# Patient Record
Sex: Female | Born: 1980 | Race: Black or African American | Hispanic: No | Marital: Single | Smoking: Current every day smoker
Health system: Southern US, Community
[De-identification: ages and names within clinical notes are randomized; demographics above are authoritative.]

## PROBLEM LIST (undated history)

## (undated) ENCOUNTER — Ambulatory Visit (HOSPITAL_COMMUNITY): Discharge status: Absent without leave | Payer: No Typology Code available for payment source

---
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
## 1999-01-01 ENCOUNTER — Inpatient Hospital Stay (HOSPITAL_COMMUNITY): Admission: AD | Admit: 1999-01-01 | Discharge: 1999-01-01 | Payer: Self-pay | Admitting: Obstetrics

## 1999-02-01 ENCOUNTER — Inpatient Hospital Stay: Admission: AD | Admit: 1999-02-01 | Discharge: 1999-02-01 | Payer: Self-pay | Admitting: *Deleted

## 1999-02-22 ENCOUNTER — Ambulatory Visit (HOSPITAL_COMMUNITY): Admission: RE | Admit: 1999-02-22 | Discharge: 1999-02-22 | Payer: Self-pay | Admitting: *Deleted

## 1999-04-08 ENCOUNTER — Ambulatory Visit (HOSPITAL_COMMUNITY): Admission: RE | Admit: 1999-04-08 | Discharge: 1999-04-08 | Payer: Self-pay | Admitting: *Deleted

## 1999-04-11 ENCOUNTER — Observation Stay (HOSPITAL_COMMUNITY): Admission: AD | Admit: 1999-04-11 | Discharge: 1999-04-12 | Payer: Self-pay | Admitting: Obstetrics

## 1999-05-09 ENCOUNTER — Inpatient Hospital Stay (HOSPITAL_COMMUNITY): Admission: AD | Admit: 1999-05-09 | Discharge: 1999-05-09 | Payer: Self-pay | Admitting: Obstetrics

## 1999-06-06 ENCOUNTER — Inpatient Hospital Stay (HOSPITAL_COMMUNITY): Admission: AD | Admit: 1999-06-06 | Discharge: 1999-06-06 | Payer: Self-pay | Admitting: Obstetrics

## 1999-06-22 ENCOUNTER — Ambulatory Visit (HOSPITAL_COMMUNITY): Admission: RE | Admit: 1999-06-22 | Discharge: 1999-06-22 | Payer: Self-pay | Admitting: Internal Medicine

## 1999-06-22 ENCOUNTER — Encounter: Payer: Self-pay | Admitting: Internal Medicine

## 1999-07-15 ENCOUNTER — Encounter: Payer: Self-pay | Admitting: Obstetrics & Gynecology

## 1999-07-15 ENCOUNTER — Encounter (INDEPENDENT_AMBULATORY_CARE_PROVIDER_SITE_OTHER): Payer: Self-pay | Admitting: Specialist

## 1999-07-15 ENCOUNTER — Inpatient Hospital Stay (HOSPITAL_COMMUNITY): Admission: AD | Admit: 1999-07-15 | Discharge: 1999-07-18 | Payer: Self-pay | Admitting: Obstetrics & Gynecology

## 1999-07-20 ENCOUNTER — Inpatient Hospital Stay (HOSPITAL_COMMUNITY): Admission: AD | Admit: 1999-07-20 | Discharge: 1999-07-20 | Payer: Self-pay | Admitting: Obstetrics

## 2003-05-11 ENCOUNTER — Emergency Department (HOSPITAL_COMMUNITY): Admission: EM | Admit: 2003-05-11 | Discharge: 2003-05-11 | Payer: Self-pay | Admitting: Emergency Medicine

## 2003-07-23 ENCOUNTER — Ambulatory Visit (HOSPITAL_COMMUNITY): Admission: RE | Admit: 2003-07-23 | Discharge: 2003-07-23 | Payer: Self-pay | Admitting: Obstetrics & Gynecology

## 2003-10-05 ENCOUNTER — Inpatient Hospital Stay (HOSPITAL_COMMUNITY): Admission: AD | Admit: 2003-10-05 | Discharge: 2003-10-06 | Payer: Self-pay | Admitting: Obstetrics

## 2003-10-13 ENCOUNTER — Ambulatory Visit (HOSPITAL_COMMUNITY): Admission: RE | Admit: 2003-10-13 | Discharge: 2003-10-13 | Payer: Self-pay | Admitting: Obstetrics

## 2003-12-21 ENCOUNTER — Inpatient Hospital Stay (HOSPITAL_COMMUNITY): Admission: AD | Admit: 2003-12-21 | Discharge: 2003-12-21 | Payer: Self-pay | Admitting: Obstetrics

## 2004-02-10 ENCOUNTER — Inpatient Hospital Stay (HOSPITAL_COMMUNITY): Admission: AD | Admit: 2004-02-10 | Discharge: 2004-02-10 | Payer: Self-pay | Admitting: Obstetrics

## 2004-03-02 ENCOUNTER — Inpatient Hospital Stay (HOSPITAL_COMMUNITY): Admission: AD | Admit: 2004-03-02 | Discharge: 2004-03-06 | Payer: Self-pay | Admitting: Obstetrics & Gynecology

## 2004-03-03 ENCOUNTER — Encounter (INDEPENDENT_AMBULATORY_CARE_PROVIDER_SITE_OTHER): Payer: Self-pay | Admitting: Specialist

## 2005-10-28 ENCOUNTER — Emergency Department (HOSPITAL_COMMUNITY): Admission: EM | Admit: 2005-10-28 | Discharge: 2005-10-28 | Payer: Self-pay | Admitting: *Deleted

## 2006-01-29 ENCOUNTER — Emergency Department (HOSPITAL_COMMUNITY): Admission: EM | Admit: 2006-01-29 | Discharge: 2006-01-29 | Payer: Self-pay | Admitting: Emergency Medicine

## 2006-02-08 ENCOUNTER — Emergency Department (HOSPITAL_COMMUNITY): Admission: EM | Admit: 2006-02-08 | Discharge: 2006-02-08 | Payer: Self-pay | Admitting: Family Medicine

## 2009-11-08 ENCOUNTER — Emergency Department (HOSPITAL_COMMUNITY)
Admission: EM | Admit: 2009-11-08 | Discharge: 2009-11-08 | Payer: Self-pay | Source: Home / Self Care | Admitting: Emergency Medicine

## 2010-06-09 LAB — URINALYSIS, ROUTINE W REFLEX MICROSCOPIC
Protein, ur: NEGATIVE mg/dL
Urobilinogen, UA: 1 mg/dL (ref 0.0–1.0)
pH: 7.5 (ref 5.0–8.0)

## 2010-06-09 LAB — COMPREHENSIVE METABOLIC PANEL
AST: 20 U/L (ref 0–37)
Albumin: 3.7 g/dL (ref 3.5–5.2)
BUN: 10 mg/dL (ref 6–23)
Calcium: 9 mg/dL (ref 8.4–10.5)
Creatinine, Ser: 0.77 mg/dL (ref 0.4–1.2)
GFR calc Af Amer: >60 mL/min (ref >60)
GFR calc non Af Amer: >60 mL/min (ref >60)
Glucose, Bld: 101 mg/dL — ABNORMAL HIGH (ref 70–99)
Potassium: 3.6 mEq/L (ref 3.5–5.1)
Total Bilirubin: 0.2 mg/dL — ABNORMAL LOW (ref 0.3–1.2)
Total Protein: 6.9 g/dL (ref 6.0–8.3)

## 2010-06-09 LAB — DIFFERENTIAL
Basophils Absolute: 0 10*3/uL (ref 0.0–0.1)
Basophils Relative: 1 % (ref 0–1)
Eosinophils Relative: 3 % (ref 0–5)
Lymphocytes Relative: 43 % (ref 12–46)
Monocytes Absolute: 0.4 10*3/uL (ref 0.1–1.0)
Monocytes Relative: 7 % (ref 3–12)
Neutrophils Relative %: 46 % (ref 43–77)

## 2010-06-09 LAB — URINE MICROSCOPIC-ADD ON

## 2010-06-09 LAB — CBC
MCH: 33.1 pg (ref 26.0–34.0)
Platelets: 241 10*3/uL (ref 150–400)
WBC: 5.3 10*3/uL (ref 4.0–10.5)

## 2010-06-19 ENCOUNTER — Inpatient Hospital Stay (INDEPENDENT_AMBULATORY_CARE_PROVIDER_SITE_OTHER)
Admission: RE | Admit: 2010-06-19 | Discharge: 2010-06-19 | Disposition: A | Discharge status: Home / Self Care | Payer: Self-pay | Source: Ambulatory Visit | Attending: Family Medicine | Admitting: Family Medicine

## 2010-06-19 DIAGNOSIS — N760 Acute vaginitis: Secondary | ICD-10-CM

## 2010-06-19 LAB — WET PREP, GENITAL: Trich, Wet Prep: NONE SEEN

## 2010-06-19 LAB — POCT URINALYSIS DIP (DEVICE)
Bilirubin Urine: NEGATIVE
Glucose, UA: NEGATIVE mg/dL
pH: 5.5 (ref 5.0–8.0)

## 2010-06-19 LAB — POCT PREGNANCY, URINE: Preg Test, Ur: NEGATIVE

## 2010-06-21 ENCOUNTER — Emergency Department (HOSPITAL_COMMUNITY)
Admission: EM | Admit: 2010-06-21 | Discharge: 2010-06-21 | Disposition: A | Discharge status: Home / Self Care | Payer: Medicaid Other | Attending: Emergency Medicine | Admitting: Emergency Medicine

## 2010-06-21 ENCOUNTER — Emergency Department (HOSPITAL_COMMUNITY): Payer: Medicaid Other

## 2010-06-21 DIAGNOSIS — M79609 Pain in unspecified limb: Secondary | ICD-10-CM | POA: Insufficient documentation

## 2010-06-21 DIAGNOSIS — R209 Unspecified disturbances of skin sensation: Secondary | ICD-10-CM | POA: Insufficient documentation

## 2010-06-21 DIAGNOSIS — M7989 Other specified soft tissue disorders: Secondary | ICD-10-CM | POA: Insufficient documentation

## 2010-06-21 DIAGNOSIS — I10 Essential (primary) hypertension: Secondary | ICD-10-CM | POA: Insufficient documentation

## 2010-06-21 LAB — URINALYSIS, ROUTINE W REFLEX MICROSCOPIC
Ketones, ur: NEGATIVE mg/dL
Nitrite: NEGATIVE
Urobilinogen, UA: 1 mg/dL (ref 0.0–1.0)

## 2010-06-21 LAB — POCT I-STAT, CHEM 8
Calcium, Ion: 1.18 mmol/L (ref 1.12–1.32)
HCT: 39 % (ref 36.0–46.0)
Potassium: 3.9 mEq/L (ref 3.5–5.1)

## 2010-06-21 LAB — URINE MICROSCOPIC-ADD ON

## 2010-06-21 LAB — GC/CHLAMYDIA PROBE AMP, GENITAL
Chlamydia, DNA Probe: NEGATIVE
GC Probe Amp, Genital: NEGATIVE

## 2010-06-21 LAB — POCT PREGNANCY, URINE: Preg Test, Ur: NEGATIVE

## 2010-08-12 NOTE — Op Note (Signed)
[name], [name]              ACCOUNT NO.:  [id number]   MEDICAL RECORD NO.:  [id number]          PATIENT TYPE:  INP   LOCATION:  9104                          FACILITY:  [facility]   PHYSICIAN:  Charles A. Clearance Coots, M.D.DATE OF BIRTH:  02-Jun-1980   DATE OF PROCEDURE:  03/03/2004  DATE OF DISCHARGE:                                 OPERATIVE REPORT   PREOPERATIVE DIAGNOSES:  1.  Term pregnancy.  2.  Pregnancy-induced hypertension.  3.  Previous cesarean section, desired repeat cesarean section.  4.  Desires sterilization.   POSTOPERATIVE DIAGNOSES:  1.  Term pregnancy.  2.  Pregnancy-induced hypertension.  3.  Previous cesarean section, desired repeat cesarean section.  4.  Desires sterilization.   PROCEDURE:  Repeat low transverse cesarean section and bilateral partial  salpingectomy, Pomeroy technique.   SURGEONS:  Charles A. Clearance Coots, M.D.   ANESTHESIA:  Spinal.   ESTIMATED BLOOD LOSS:  800 mL.   INTRAVENOUS FLUIDS:  2100 mL.   URINE OUT:  500 mL.   COMPLICATIONS:  None.   DRAINS:  Foley to gravity.   FINDINGS:  Viable female at 0658,  Apgars of 8 at one minute, 9 at five  minutes.  Weight of 6 pounds, 3 ounces.  Normal uterus, ovaries, and  fallopian tubes.   SPECIMENS:  Approximately 2 cm segments of right and left fallopian tubes.   OPERATION:  The patient was brought to the operating room and after  satisfactory spinal anesthesia, the abdomen was prepped and draped in the  usual sterile fashion.  A Pfannenstiel skin incision was made through the  previous scar with the scalpel down to the fascia.  The fascia was nicked in  the midline and the fascial incision was extended to the left and to the  right with curved Mayo scissors.  The superior and inferior fascial edges  were taken off the rectus muscles with both blunt and sharp dissection.  The  rectus muscle was sharply divided in the midline.  The peritoneum was  entered digitally.  The peritoneum was then  extended to the left and to the  right digitally.  The bladder blade was positioned.  The vesicouterine fold  of the peritoneum above the reflection of the urinary bladder was grasped  with forceps and was incised and undermined with Metzenbaum scissors.  The  incision was extended to the left and to the right with the Metzenbaum  scissors.  The bladder flap was bluntly developed and the bladder blade was  respositioned in front of the urinary bladder, placing it well out of the  operative field.  The uterus was then entered in the lower uterine segment  transversely with the scalpel.  Clear amniotic fluid was expelled.  The  uterine incision was then extended to the left and to the right digitally.  The fetal was vertex was then delivered with the aid of fundal pressure from  the assistant.  The infant's mouth and nose were suctioned with a suction  bulb and the delivery was completed with aid of fundal pressure from the  assistant.  The umbilical cord was  doubly clamped and cut and the infant was  handed off to the nursery staff.  Cord blood was obtained. The placenta was  spontaneously expelled from the uterine cavity intact.  The uterus was  exteriorized and the endometrial surface was thoroughly debrided with the  dry lap sponge.  The edges of the uterine incision were grasped with ring  forceps.  The uterus was closed with continuous interlocking suture of 0  Monocryl.  Hemostasis was excellent.   Attention was then turned above the tubal ligation procedure.  The left  fallopian tube was grasped with a Babcock clamp and was identified from the  cornual end to the fimbrial end and grasped with a Babcock clamp.  The  knuckle of tube beneath the Babcock clamp in the isthmic area of the tube  was then doubly ligated with #1 plain cat gut.  Dissection of tube above the  knot was excised and submitted to pathology for evaluation.  There was no  active bleeding from the tubal stumps.  The  same procedure was performed on  the opposite side without complications.  The uterus was then placed back in  its normal anatomic position.  The pelvic cavity was thoroughly irrigated  with warm saline solution and all clots were removed.   The abdomen was then closed as follows:  The peritoneum was closed with  continuous suture of 2-0 Monocryl.  The fascia was closed with continuous  suture of 0 PDS from each corner to the center.  The subcutaneous tissue was  thoroughly irrigated with warm saline solution and all areas of subcutaneous  bleeding were coagulated with the Bovie.  The skin was then closed with  continuous subcuticular suture of 4-0 Monocryl.  Sterile bandage was applied  to the incision closure.  Surgical technician indicated that all needle,  sponge, and instrument counts were correct.  The patient tolerated the  procedure well and was transported to the recovery room in satisfactory  condition.     Char   CAH/MEDQ  D:  03/03/2004  T:  03/03/2004  Job:  [id number]

## 2010-08-12 NOTE — Op Note (Signed)
[facility]  Patient:    [name], [name]                     MRN: [id number] Proc. Date: 07/15/99 Adm. Date:  [id number] Disc. Date: [id number] Attending:  Tammi Sou                           Operative Report  PREOPERATIVE DIAGNOSES:       Intrauterine pregnancy at 72- to 38-weeks gestation with fetal tachycardia and decreased fetal activity.  POSTOPERATIVE DIAGNOSES:      Intrauterine pregnancy at 20- to 38-weeks gestation with fetal tachycardia and decreased fetal activity.  OPERATION:                    Low transverse cesarean.  OPERATORS:                    Conni Elliot, M.D.                               Delano Metz, M.D.  ANESTHESIA:  FINDINGS:                     A 5-pound 13-ounce female with Apgars pending, cord pH of 7.27.  DESCRIPTION OF PROCEDURE:     After placing the patient under spinal anesthetic, with patient supine and left lateral tilt position and receiving oxygen, abdomen was prepped and draped in a sterile fashion.  A low transverse Pfannenstiel incision was made and incision was made through skin, subcutaneous, fascia and rectus muscle separated in the midline.  Peritoneal cavity was entered and bladder flap created.  Low transverse uterine incision was made.  Baby was delivered in a vertex presentation, cord double clamped and cut and baby handed to neonatologist in attendance.  Placenta was delivered spontaneously.  The left uterine arteries were lacerated and therefore, the blood loss was approximately 1000 cc; however, this was repaired without difficulty and hemostasis was adequate at the closure of the uterus.  Bladder flap, anterior peritoneum, fascia, subcutaneous and skin were closed in routine fashion.  Estimated blood loss was approximately 1000 cc. Needle, instrument and sponge count correct. DD:  07/16/99 TD:  07/18/99 Job: [id number]

## 2010-08-12 NOTE — Op Note (Signed)
[facility]  Patient:    [name], [name]                     MRN: [id number] Adm. Date:  [id number] Attending:  Antionette Char                           Operative Report  NO DICTATION DD:  07/18/99 TD:  07/18/99 Job: [id number]

## 2010-08-12 NOTE — Discharge Summary (Signed)
[facility]  Patient:    [name], [name]                     MRN: [id number] Adm. Date:  [id number] Disc. Date: 07/18/99 Attending:  Antionette Char Dictator:   Gilmore Laroche, M.D.                           Discharge Summary  DISCHARGE DIAGNOSES:          1. Status post primary low transverse cesarean                                  section.                               2. Persistent fetal tachycardia.                               3. Decreased fetal activity.  DISCHARGE MEDICATIONS:        1. Percocet 1/1 p.o. q.6h. p.r.n. (given prescription                                  for 15 with no refills).                               2. Motrin 600 mg p.o. q.6h. (30 given).                               3. Ferrous sulfate 325 mg 1/1 p.o. q.d.  PROCEDURE:                    Primary low transverse cesarean section.  COMPLICATIONS:                None.  BRIEF HISTORY:                This is a 30 year old, gravida 2, para 1-0-0-1, who is at 37-4/7 weeks who came in because of contractions.  She was found to have decreased irritability on the monitor and was admitted to L&D.  HOSPITAL COURSE:              The baby had poor variability and tachycardia on monitor and thus she was taken to cesarean section.  She tolerated this well without problems and delivered a viable babygirl with a cord pH of 7.27 and Apgars of 8 and 9.  She had an uncomplicated postoperative course.  She was ambulating and tolerating p.o.  The wound was clean and dry and the incision looked good.  The  patient was eager to go home on July 18, 1999.  FOLLOW-UP:                    The patient is to follow up at [facility] in six weeks.  She is taking Depo for birth control and was given instructions regarding onset of severe abdominal pain, purulence, or dehiscence as reasons for return. DD:  07/18/99 TD:  07/18/99 Job: [id number]

## 2010-11-27 ENCOUNTER — Inpatient Hospital Stay (INDEPENDENT_AMBULATORY_CARE_PROVIDER_SITE_OTHER)
Admission: RE | Admit: 2010-11-27 | Discharge: 2010-11-27 | Disposition: A | Discharge status: Home / Self Care | Payer: Medicaid Other | Source: Ambulatory Visit | Attending: Family Medicine | Admitting: Family Medicine

## 2010-11-27 DIAGNOSIS — L538 Other specified erythematous conditions: Secondary | ICD-10-CM

## 2010-11-27 DIAGNOSIS — N760 Acute vaginitis: Secondary | ICD-10-CM

## 2010-12-25 ENCOUNTER — Emergency Department (HOSPITAL_COMMUNITY)
Admission: EM | Admit: 2010-12-25 | Discharge: 2010-12-25 | Disposition: A | Discharge status: Home / Self Care | Payer: Medicaid Other | Attending: Emergency Medicine | Admitting: Emergency Medicine

## 2010-12-25 DIAGNOSIS — L0201 Cutaneous abscess of face: Secondary | ICD-10-CM | POA: Insufficient documentation

## 2010-12-25 DIAGNOSIS — I10 Essential (primary) hypertension: Secondary | ICD-10-CM | POA: Insufficient documentation

## 2010-12-25 DIAGNOSIS — R51 Headache: Secondary | ICD-10-CM | POA: Insufficient documentation

## 2010-12-25 DIAGNOSIS — R220 Localized swelling, mass and lump, head: Secondary | ICD-10-CM | POA: Insufficient documentation

## 2010-12-25 DIAGNOSIS — L03211 Cellulitis of face: Secondary | ICD-10-CM | POA: Insufficient documentation

## 2012-05-18 ENCOUNTER — Emergency Department (HOSPITAL_COMMUNITY)
Admission: EM | Admit: 2012-05-18 | Discharge: 2012-05-18 | Disposition: A | Discharge status: Home / Self Care | Payer: Medicaid Other | Attending: Emergency Medicine | Admitting: Emergency Medicine

## 2012-05-18 ENCOUNTER — Encounter (HOSPITAL_COMMUNITY): Payer: Self-pay | Admitting: Emergency Medicine

## 2012-05-18 DIAGNOSIS — L299 Pruritus, unspecified: Secondary | ICD-10-CM | POA: Insufficient documentation

## 2012-05-18 DIAGNOSIS — B359 Dermatophytosis, unspecified: Secondary | ICD-10-CM | POA: Insufficient documentation

## 2012-05-18 DIAGNOSIS — F17200 Nicotine dependence, unspecified, uncomplicated: Secondary | ICD-10-CM | POA: Insufficient documentation

## 2012-05-18 MED ORDER — CLOTRIMAZOLE 1 % EX CREA
TOPICAL_CREAM | Freq: Two times a day (BID) | CUTANEOUS | Status: DC
  Filled 2012-05-18: qty 15

## 2012-05-18 MED ORDER — CLOTRIMAZOLE 1 % EX CREA: TOPICAL_CREAM | Freq: Two times a day (BID) | CUTANEOUS | Status: DC

## 2012-05-18 NOTE — ED Provider Notes (Signed)
History  This chart was scribed for Roxy Horseman, non-physician practitioner, working with Carleene Cooper III, MD by Bennett Scrape, ED Scribe. This patient was seen in room TR08C/TR08C and the patient's care was started at 10:39 PM.  CSN: [id number]  Arrival date & time 05/18/12  2110   First MD Initiated Contact with Patient 05/18/12 2239      Chief Complaint  Patient presents with  . Open Wound     The history is provided by the patient. No language interpreter was used.    [name] is a 32 y.o. female who presents to the Emergency Department for chief complaint of a 7 days of gradual onset, gradually worsening, constant red wound with associated itchiness to the left lower leg. She denies having prior episodes of similar symptoms. She denies any known injuries, recent falls or contact with animals. She denies fever, neck pain, sore throat, visual disturbance, CP, cough, SOB, abdominal pain, nausea, emesis, diarrhea, urinary symptoms, back pain, HA, weakness, numbness and rash as associated symptoms. She does not have a h/o chronic medical conditions and is a current everyday smoker but denies alcohol use.  History reviewed. No pertinent past medical history.  Past Surgical History  Procedure Laterality Date  . Cesarean section      No family history on file.  History  Substance Use Topics  . Smoking status: Current Every Day Smoker  . Smokeless tobacco: Not on file  . Alcohol Use: No    No OB history provided.  Review of Systems  A complete 10 system review of systems was obtained and all systems are negative except as noted in the HPI and PMH.   Allergies  Review of patient's allergies indicates no known allergies.  Home Medications  No current outpatient prescriptions on file.  Triage Vitals: BP 118/60  Pulse 67  Temp(Src) 98.1 F (36.7 C) (Oral)  Resp 16  SpO2 100%  LMP 05/09/2012  Physical Exam  Nursing note and vitals  reviewed. Constitutional: She is oriented to person, place, and time. She appears well-developed and well-nourished. No distress.  HENT:  Head: Normocephalic and atraumatic.  Eyes: EOM are normal.  Neck: Neck supple. No tracheal deviation present.  Cardiovascular: Normal rate.   Pulmonary/Chest: Effort normal. No respiratory distress.  Musculoskeletal: Normal range of motion.  Neurological: She is alert and oriented to person, place, and time.  Skin: Skin is warm and dry.  2 X 2 cm lesion characteristic of ring worm to the left lower leg  Psychiatric: She has a normal mood and affect. Her behavior is normal.    ED Course  Procedures (including critical care time)  DIAGNOSTIC STUDIES: Oxygen Saturation is 100% on room air, normal by my interpretation.    COORDINATION OF CARE: 10:53 PM- Discussed discharge plan which includes cream with pt and pt agreed to plan.   Labs Reviewed - No data to display No results found.   1. Ringworm       MDM  Uncomplicated tinea infection.     I personally performed the services described in this documentation, which was scribed in my presence. The recorded information has been reviewed and is accurate.    Roxy Horseman, PA-C 05/19/12 0005

## 2012-05-18 NOTE — ED Notes (Signed)
Pt denies fever/chills; pt denies n/v/d; pt alert and mentating appropriately; pt denies pain to area; pt denies numbness/tingling; pt states area is itchy

## 2012-05-18 NOTE — ED Notes (Signed)
Pt ambulatory leaving ed; pt given d/c teaching and prescriptions; pt educated on wound care and cream application; pt alert and mentating appropriately upon d/c. Pt has no further questions upon d/c teaching.

## 2012-05-18 NOTE — ED Notes (Signed)
Pt c/o blister type wound to left lower leg x's 1 week.  No known injury, st's area itches

## 2012-05-19 NOTE — ED Provider Notes (Signed)
Medical screening examination/treatment/procedure(s) were performed by non-physician practitioner and as supervising physician I was immediately available for consultation/collaboration.   Carleene Cooper III, MD 05/19/12 1320

## 2012-06-02 ENCOUNTER — Encounter (HOSPITAL_COMMUNITY): Payer: Self-pay | Admitting: *Deleted

## 2012-06-02 ENCOUNTER — Emergency Department (HOSPITAL_COMMUNITY)
Admission: EM | Admit: 2012-06-02 | Discharge: 2012-06-02 | Disposition: A | Discharge status: Home / Self Care | Payer: Medicaid Other | Source: Home / Self Care

## 2012-06-02 MED ORDER — PROMETHAZINE HCL 12.5 MG PO TABS: 12.5000 mg | ORAL_TABLET | Freq: Four times a day (QID) | ORAL | Status: DC | PRN

## 2012-06-02 MED ORDER — HYDROCODONE-ACETAMINOPHEN 5-500 MG PO TABS: 1.0000 | ORAL_TABLET | Freq: Four times a day (QID) | ORAL | Status: DC | PRN

## 2012-06-02 NOTE — ED Notes (Signed)
Patient complains of abdominal pain, nausea, vomiting and diarrhea that started this morning with fever/chills.

## 2012-06-02 NOTE — ED Provider Notes (Addendum)
History     CSN: [id number]  Arrival date & time 06/02/12  1250   First MD Initiated Contact with Patient 06/02/12 1253      Chief Complaint  Patient presents with  . Nausea    (Consider location/radiation/quality/duration/timing/severity/associated sxs/prior treatment) HPI This is a 32 year old female who presents with complaints of vomiting and diarrhea and lower abdominal pain which started this morning. She has vomited twice and had about 3 loose bowel movements. She has not had any high fevers. She has pain in her lower abdomen which shows bilateral. She has not been around anyone who is sick with similar symptoms and did not eat out in a restaurant last night. No noted blood in vomitus or in stool.   History reviewed. No pertinent past medical history.  Past Surgical History  Procedure Laterality Date  . Cesarean section      No family history on file.  History  Substance Use Topics  . Smoking status: Current Every Day Smoker  . Smokeless tobacco: Not on file  . Alcohol Use: No    OB History   Grav Para Term Preterm Abortions TAB SAB Ect Mult Living                  Review of Systems  Constitutional: Positive for appetite change and fatigue.  HENT: Negative.   Eyes: Negative.   Respiratory: Negative.   Cardiovascular: Negative.   Gastrointestinal: Positive for vomiting, abdominal pain and diarrhea.  Endocrine: Negative.   Genitourinary: Negative.   Musculoskeletal: Negative.   Skin: Negative.   Neurological: Negative.   Hematological: Negative.   Psychiatric/Behavioral: Negative.     Allergies  Review of patient's allergies indicates no known allergies.  Home Medications   Current Outpatient Rx  Name  Route  Sig  Dispense  Refill  . clotrimazole (LOTRIMIN) 1 % cream   Topical   Apply topically 2 (two) times daily.   30 g   1   . HYDROcodone-acetaminophen (VICODIN) 5-500 MG per tablet   Oral   Take 1 tablet by mouth every 6 (six) hours as  needed for pain.   10 tablet   0   . promethazine (PHENERGAN) 12.5 MG tablet   Oral   Take 1 tablet (12.5 mg total) by mouth every 6 (six) hours as needed for nausea.   30 tablet   0     BP 113/72  Pulse 91  Temp(Src) 98.5 F (36.9 C) (Oral)  Resp 18  SpO2 96%  LMP 06/02/2012  Physical Exam  Constitutional: She appears well-developed and well-nourished.  HENT:  Head: Normocephalic and atraumatic.  Eyes: Conjunctivae and EOM are normal. Pupils are equal, round, and reactive to light.  Neck: Normal range of motion. Neck supple.  Cardiovascular: Normal rate and regular rhythm.   Pulmonary/Chest: Effort normal and breath sounds normal.  Abdominal: Soft. She exhibits no mass. There is tenderness. There is guarding. There is no rebound.  Tenderness more prominent on the left mid and lower abdomen and less pronounced in the right lower quadrant. No significant tenderness in the suprapubic area.  Neurological: She is alert.  Skin: Skin is warm and dry.  Psychiatric: She has a normal mood and affect.    ED Course  Procedures (including critical care time)  Labs Reviewed - No data to display No results found.   1. Viral gastroenteritis       MDM  Phenergan and low-dose Vicodin, limited diet with only clear liquids over the  next 24 hours to be advanced slowly over the next 3 days. If symptoms do not improve in the next 24-48 hours or if they worsen, she is advised to go to the emergency room.        Calvert Cantor, MD 06/02/12 1439  Calvert Cantor, MD 06/02/12 1610

## 2012-10-20 ENCOUNTER — Emergency Department (HOSPITAL_COMMUNITY)
Admission: EM | Admit: 2012-10-20 | Discharge: 2012-10-20 | Disposition: A | Discharge status: Home / Self Care | Payer: Medicaid Other | Attending: Emergency Medicine | Admitting: Emergency Medicine

## 2012-10-20 ENCOUNTER — Encounter (HOSPITAL_COMMUNITY): Payer: Self-pay | Admitting: *Deleted

## 2012-10-20 DIAGNOSIS — S61216A Laceration without foreign body of right little finger without damage to nail, initial encounter: Secondary | ICD-10-CM

## 2012-10-20 DIAGNOSIS — Y9389 Activity, other specified: Secondary | ICD-10-CM | POA: Insufficient documentation

## 2012-10-20 DIAGNOSIS — S61209A Unspecified open wound of unspecified finger without damage to nail, initial encounter: Secondary | ICD-10-CM | POA: Insufficient documentation

## 2012-10-20 DIAGNOSIS — Z23 Encounter for immunization: Secondary | ICD-10-CM | POA: Insufficient documentation

## 2012-10-20 DIAGNOSIS — Y9289 Other specified places as the place of occurrence of the external cause: Secondary | ICD-10-CM | POA: Insufficient documentation

## 2012-10-20 DIAGNOSIS — W2209XA Striking against other stationary object, initial encounter: Secondary | ICD-10-CM | POA: Insufficient documentation

## 2012-10-20 DIAGNOSIS — F17200 Nicotine dependence, unspecified, uncomplicated: Secondary | ICD-10-CM | POA: Insufficient documentation

## 2012-10-20 MED ORDER — TETANUS-DIPHTH-ACELL PERTUSSIS 5-2.5-18.5 LF-MCG/0.5 IM SUSP
0.5000 mL | Freq: Once | INTRAMUSCULAR | Status: AC
  Administered 2012-10-20: 0.5 mL via INTRAMUSCULAR
  Filled 2012-10-20: qty 0.5

## 2012-10-20 NOTE — ED Notes (Signed)
The pt has a laceration to the rt little finger.  She punched a mirror earlier today.  Minimal bleeding

## 2012-10-20 NOTE — ED Provider Notes (Signed)
  CSN: [id number]     Arrival date & time 10/20/12  2058 History     First MD Initiated Contact with Patient 10/20/12 2113     Chief Complaint  Patient presents with  . Laceration   (Consider location/radiation/quality/duration/timing/severity/associated sxs/prior Treatment) HPI History provided by pt.   Pt punched a mirror just pta and sustained a laceration to palmar surface of right pinky.  C/o severe pain.  No associated paresthesias.  Has not cleaned.  Last tetanus unknown.   History reviewed. No pertinent past medical history. Past Surgical History  Procedure Laterality Date  . Cesarean section     No family history on file. History  Substance Use Topics  . Smoking status: Current Every Day Smoker  . Smokeless tobacco: Not on file  . Alcohol Use: No   OB History   Grav Para Term Preterm Abortions TAB SAB Ect Mult Living                 Review of Systems  All other systems reviewed and are negative.    Allergies  Benadryl  Home Medications  No current outpatient prescriptions on file. BP 119/49  Pulse 87  Temp(Src) 97.7 F (36.5 C) (Oral)  Resp 19  SpO2 96% Physical Exam  Nursing note and vitals reviewed. Constitutional: She is oriented to person, place, and time. She appears well-developed and well-nourished. No distress.  HENT:  Head: Normocephalic and atraumatic.  Eyes:  Normal appearance  Neck: Normal range of motion.  Pulmonary/Chest: Effort normal.  Musculoskeletal: Normal range of motion.  Irregular, Subq, hemostatic and clean lac w/ overlying skin flap on palmar surface lateral right pinky.  No foreign body.  Decreased sensation just distal, but nml sensation rest of finger.  Brisk cap refill.  Neurological: She is alert and oriented to person, place, and time.  Psychiatric: She has a normal mood and affect. Her behavior is normal.    ED Course   Procedures (including critical care time) LACERATION REPAIR Performed by: Otilio Miu Authorized by: Ruby Cola E Consent: Verbal consent obtained. Risks and benefits: risks, benefits and alternatives were discussed Consent given by: patient Patient identity confirmed: provided demographic data Prepped and Draped in normal sterile fashion Wound explored  Laceration Location: right pinky  Laceration Length: 2cm  No Foreign Bodies seen or palpated  Anesthesia: local infiltration  Digital block: lidocaine 2% w/out epinephrine  Anesthetic total: 4 ml  Irrigation method: Nursing staff performed Amount of cleaning: standard  Skin closure: prolene 4.0 Number of sutures: 6  Technique: simple interrupted  Patient tolerance: Patient tolerated the procedure well with no immediate complications.  Labs Reviewed - No data to display No results found. No diagnosis found.  MDM  32yo healthy F presents w/ lac of right pinky.  Cleaned by nursing staff and sutured by myself.  Tetanus updated.  Recommended f/u with [facility] for suture removal.  Return precautions discussed.   Arie Sabina Zannah Melucci, PA-C 10/20/12 2303

## 2012-10-21 NOTE — ED Provider Notes (Signed)
Medical screening examination/treatment/procedure(s) were performed by non-physician practitioner and as supervising physician I was immediately available for consultation/collaboration.  Derwood Kaplan, MD 10/21/12 [phone]

## 2012-10-22 ENCOUNTER — Encounter (HOSPITAL_COMMUNITY): Payer: Self-pay

## 2012-10-22 ENCOUNTER — Emergency Department (HOSPITAL_COMMUNITY)
Admission: EM | Admit: 2012-10-22 | Discharge: 2012-10-22 | Disposition: A | Discharge status: Home / Self Care | Payer: Medicaid Other | Attending: Emergency Medicine | Admitting: Emergency Medicine

## 2012-10-22 DIAGNOSIS — Y929 Unspecified place or not applicable: Secondary | ICD-10-CM | POA: Insufficient documentation

## 2012-10-22 DIAGNOSIS — F17200 Nicotine dependence, unspecified, uncomplicated: Secondary | ICD-10-CM | POA: Insufficient documentation

## 2012-10-22 DIAGNOSIS — Z5189 Encounter for other specified aftercare: Secondary | ICD-10-CM

## 2012-10-22 DIAGNOSIS — S61209A Unspecified open wound of unspecified finger without damage to nail, initial encounter: Secondary | ICD-10-CM | POA: Insufficient documentation

## 2012-10-22 DIAGNOSIS — X58XXXA Exposure to other specified factors, initial encounter: Secondary | ICD-10-CM | POA: Insufficient documentation

## 2012-10-22 DIAGNOSIS — Y939 Activity, unspecified: Secondary | ICD-10-CM | POA: Insufficient documentation

## 2012-10-22 MED ORDER — OXYCODONE-ACETAMINOPHEN 5-325 MG PO TABS: 1.0000 | ORAL_TABLET | Freq: Four times a day (QID) | ORAL | Status: DC | PRN

## 2012-10-22 MED ORDER — CEPHALEXIN 500 MG PO CAPS: 500.0000 mg | ORAL_CAPSULE | Freq: Four times a day (QID) | ORAL | Status: DC

## 2012-10-22 NOTE — ED Notes (Signed)
Pt c/o pain to Right pinky finger starting today after removing her finger splint. Pt seen here Sunday for punching a mirror and cutting her Right pinky finger, stitches placed and finger splint applied. Pt states when she pulled off her splint she also pulled meat off with it. Stitches still in place

## 2012-10-22 NOTE — ED Provider Notes (Signed)
CSN: [id number]     Arrival date & time 10/22/12  1636 History    This chart was scribed for Dorthula Matas, non-physician practitioner working with Gilda Crease, * by Leone Payor, ED Scribe. This patient was seen in room TR10C/TR10C and the patient's care was started at 1636.   First MD Initiated Contact with Patient 10/22/12 1647     Chief Complaint  Patient presents with  . Hand Pain    The history is provided by the patient. No language interpreter was used.    HPI Comments: ABBYGAIL WILLHOITE is a 32 y.o. female who presents to the Emergency Department complaining of constant right pinky finger pain starting today. States she was seen on 10/20/12 for a laceration after punching a mirror when she had stiches and a finger splint placed. States she pulled off her splint today and some skin was pulled off with it. The stiches are still in place and there is no active bleeding. Pt was not given antibiotics when she was previously seen. Denies fever, numbness.    History reviewed. No pertinent past medical history. Past Surgical History  Procedure Laterality Date  . Cesarean section     History reviewed. No pertinent family history. History  Substance Use Topics  . Smoking status: Current Every Day Smoker  . Smokeless tobacco: Not on file  . Alcohol Use: No   OB History   Grav Para Term Preterm Abortions TAB SAB Ect Mult Living                 Review of Systems  Constitutional: Negative for fever.  Skin: Positive for wound.  Neurological: Negative for numbness.  All other systems reviewed and are negative.    Allergies  Benadryl  Home Medications  No current outpatient prescriptions on file. BP 99/84  Pulse 83  Temp(Src) 98.4 F (36.9 C) (Oral)  Resp 16  SpO2 99% Physical Exam  Nursing note and vitals reviewed. Constitutional: She appears well-developed and well-nourished. No distress.  HENT:  Head: Normocephalic and atraumatic.  Eyes: Pupils are  equal, round, and reactive to light.  Neck: Normal range of motion. Neck supple.  Cardiovascular: Normal rate and regular rhythm.   Pulmonary/Chest: Effort normal.  Abdominal: Soft.  Musculoskeletal:  Laceration to right lateral portion of pinky has multiple intact sutures. A small area of skin has been pulled and is an open wound. Unable to close because flap of skin is gone and wound is old  Neurological: She is alert.  Skin: Skin is warm and dry.    ED Course   NERVE BLOCK Date/Time: 10/22/2012 5:03 PM Performed by: Dorthula Matas Authorized by: Dorthula Matas Consent: Verbal consent obtained. Consent given by: patient Patient identity confirmed: verbally with patient Indications: pain relief Body area: upper extremity Nerve: digital Laterality: right Local anesthetic: lidocaine 1% without epinephrine Outcome: pain improved   (including critical care time)  DIAGNOSTIC STUDIES: Oxygen Saturation is 99% on RA, normal by my interpretation.    COORDINATION OF CARE: 4:56 PM Discussed treatment plan with pt at bedside and pt agreed to plan.   Labs Reviewed - No data to display No results found. 1. Visit for wound check     MDM    Will start on abx since wound has opened and give rx for pain medication. Sutures have been in for 3 days, will be ready to come out in 4-5 days,.  32 y.o.Harland Dingwall Koob's evaluation in the Emergency Department  is complete. It has been determined that no acute conditions requiring further emergency intervention are present at this time. The patient/guardian have been advised of the diagnosis and plan. We have discussed signs and symptoms that warrant return to the ED, such as changes or worsening in symptoms.  Vital signs are stable at discharge. Filed Vitals:   10/22/12 1642  BP: 99/84  Pulse: 83  Temp: 98.4 F (36.9 C)  Resp: 16    Patient/guardian has voiced understanding and agreed to follow-up with the PCP or specialist. I  personally performed the services described in this documentation, which was scribed in my presence. The recorded information has been reviewed and is accurate.   Dorthula Matas, PA-C 10/22/12 1706

## 2012-10-23 NOTE — ED Provider Notes (Signed)
Medical screening examination/treatment/procedure(s) were performed by non-physician practitioner and as supervising physician I was immediately available for consultation/collaboration.    Jais Demir J. Chandni Gagan, MD 10/23/12 1544 

## 2012-10-27 ENCOUNTER — Emergency Department (INDEPENDENT_AMBULATORY_CARE_PROVIDER_SITE_OTHER)
Admission: EM | Admit: 2012-10-27 | Discharge: 2012-10-27 | Disposition: A | Discharge status: Home / Self Care | Payer: Medicaid Other | Source: Home / Self Care

## 2012-10-27 ENCOUNTER — Encounter (HOSPITAL_COMMUNITY): Payer: Self-pay

## 2012-10-27 DIAGNOSIS — Z5189 Encounter for other specified aftercare: Secondary | ICD-10-CM

## 2012-10-27 DIAGNOSIS — S61411D Laceration without foreign body of right hand, subsequent encounter: Secondary | ICD-10-CM

## 2012-10-27 NOTE — ED Notes (Signed)
Suture removal, right hand , pinky

## 2012-10-27 NOTE — ED Provider Notes (Signed)
[name] is a 32 y.o. female who presents to Urgent Care today for suture removal. Patient had laceration of her left ulnar fifth digit one week ago. This was treated with sutures. She's here today for suture removal. She feels well now some tingling and burning at the site of the laceration but otherwise feels well.   PMH reviewed. Healthy otherwise History  Substance Use Topics  . Smoking status: Current Every Day Smoker  . Smokeless tobacco: Not on file  . Alcohol Use: No   ROS as above Medications reviewed. No current facility-administered medications for this encounter.   Current Outpatient Prescriptions  Medication Sig Dispense Refill  . cephALEXin (KEFLEX) 500 MG capsule Take 1 capsule (500 mg total) by mouth 4 (four) times daily.  20 capsule  0  . HYDROcodone-acetaminophen (NORCO/VICODIN) 5-325 MG per tablet Take 1 tablet by mouth every 6 (six) hours as needed for pain.      Marland Kitchen oxyCODONE-acetaminophen (PERCOCET/ROXICET) 5-325 MG per tablet Take 1 tablet by mouth every 6 (six) hours as needed for pain.  10 tablet  0    Exam:  BP 120/56  Pulse 76  Temp(Src) 98.2 F (36.8 C) (Oral)  Resp 17  SpO2 96%  LMP 10/20/2012 Gen: Well NAD Right hand: Well appearing well healing laceration involving the ulnar side of her right fifth digit. Well-appearing sutures.   The sutures were removed without incident patient tolerated this procedure well.   No results found for this or any previous visit (from the past 24 hour(s)). No results found.  Assessment and Plan: 32 y.o. female with suture removal.  Sutures removed wound care discussed. Followup as needed.      Rodolph Bong, MD 10/27/12 [phone]

## 2015-04-14 ENCOUNTER — Encounter (HOSPITAL_COMMUNITY): Payer: Self-pay | Admitting: Emergency Medicine

## 2015-04-14 ENCOUNTER — Emergency Department (HOSPITAL_COMMUNITY)
Admission: EM | Admit: 2015-04-14 | Discharge: 2015-04-14 | Disposition: A | Discharge status: Home / Self Care | Payer: Medicaid Other | Attending: Emergency Medicine | Admitting: Emergency Medicine

## 2015-04-14 DIAGNOSIS — A5901 Trichomonal vulvovaginitis: Secondary | ICD-10-CM | POA: Insufficient documentation

## 2015-04-14 DIAGNOSIS — B373 Candidiasis of vulva and vagina: Secondary | ICD-10-CM | POA: Diagnosis not present

## 2015-04-14 DIAGNOSIS — F17210 Nicotine dependence, cigarettes, uncomplicated: Secondary | ICD-10-CM | POA: Insufficient documentation

## 2015-04-14 DIAGNOSIS — L299 Pruritus, unspecified: Secondary | ICD-10-CM | POA: Diagnosis present

## 2015-04-14 DIAGNOSIS — B3731 Acute candidiasis of vulva and vagina: Secondary | ICD-10-CM

## 2015-04-14 LAB — WET PREP, GENITAL: Sperm: NONE SEEN

## 2015-04-14 LAB — URINALYSIS, ROUTINE W REFLEX MICROSCOPIC
Bilirubin Urine: NEGATIVE
Glucose, UA: NEGATIVE mg/dL
Ketones, ur: NEGATIVE mg/dL
NITRITE: NEGATIVE
Protein, ur: NEGATIVE mg/dL
SPECIFIC GRAVITY, URINE: 1.026 (ref 1.005–1.030)
pH: 5.5 (ref 5.0–8.0)

## 2015-04-14 LAB — URINE MICROSCOPIC-ADD ON

## 2015-04-14 LAB — I-STAT BETA HCG BLOOD, ED (MC, WL, AP ONLY)

## 2015-04-14 MED ORDER — FLUCONAZOLE 100 MG PO TABS
150.0000 mg | ORAL_TABLET | Freq: Once | ORAL | Status: AC
  Administered 2015-04-14: 150 mg via ORAL
  Filled 2015-04-14: qty 2

## 2015-04-14 MED ORDER — KETOCONAZOLE 2 % EX CREA: TOPICAL_CREAM | CUTANEOUS | Status: DC

## 2015-04-14 MED ORDER — METRONIDAZOLE 500 MG PO TABS
2000.0000 mg | ORAL_TABLET | Freq: Once | ORAL | Status: AC
  Administered 2015-04-14: 2000 mg via ORAL
  Filled 2015-04-14: qty 4

## 2015-04-14 NOTE — ED Notes (Signed)
Pt ambulated to restroom. 

## 2015-04-14 NOTE — ED Notes (Signed)
Patient states vaginal itching that started Sunday morning.  Patient denies other symptoms. Patient states her Family planning office couldn't see her until Friday.  Patient states hasn't taken anything at home for symptoms.

## 2015-04-14 NOTE — ED Provider Notes (Signed)
CSN: [id number]     Arrival date & time 04/14/15  0830 History   First MD Initiated Contact with Patient 04/14/15 0930     Chief Complaint  Patient presents with  . Vaginal Itching     (Consider location/radiation/quality/duration/timing/severity/associated sxs/prior Treatment) HPI  [name] Is a 35 year old female who presents emergency Department with chief complaint of vaginal itching. She has been in a long-term monogamous relationship. However, states that last week she "stepped out of her relationship." She had 2 episodes of unprotected sexual intercourse. 2 days ago she began having itching. She states it is predominantly on the labia minora and at the introitus. She has not noticed any vaginal discharge, foul odor, urinary symptoms. She denies abdominal pain, History reviewed. No pertinent past medical history. Past Surgical History  Procedure Laterality Date  . Cesarean section     No family history on file. Social History  Substance Use Topics  . Smoking status: Current Every Day Smoker -- 1.50 packs/day    Types: Cigarettes  . Smokeless tobacco: None  . Alcohol Use: Yes     Comment: socially   OB History    No data available     Review of Systems    Allergies  Benadryl  Home Medications   Prior to Admission medications   Not on File   BP 113/80 mmHg  Pulse 65  Temp(Src) 99.1 F (37.3 C) (Oral)  Resp 18  Ht  (1.499 m)  Wt 73.483 kg  BMI 32.70 kg/m2  SpO2 99%  LMP 03/26/2015 Physical Exam  Constitutional: She is oriented to person, place, and time. She appears well-developed and well-nourished. No distress.  HENT:  Head: Normocephalic and atraumatic.  Eyes: Conjunctivae are normal. No scleral icterus.  Neck: Normal range of motion.  Cardiovascular: Normal rate, regular rhythm and normal heart sounds.  Exam reveals no gallop and no friction rub.   No murmur heard. Pulmonary/Chest: Effort normal and breath sounds normal. No  respiratory distress.  Abdominal: Soft. Bowel sounds are normal. She exhibits no distension and no mass. There is no tenderness. There is no guarding.  Genitourinary:  Pelvic exam: normal external genitalia, vulva, vagina, cervix, uterus and adnexa.   Neurological: She is alert and oriented to person, place, and time.  Skin: Skin is warm and dry. She is not diaphoretic.  Nursing note and vitals reviewed.   ED Course  Procedures (including critical care time) Labs Review Labs Reviewed  WET PREP, GENITAL - Abnormal; Notable for the following:    Yeast Wet Prep HPF POC PRESENT (*)    Trich, Wet Prep PRESENT (*)    Clue Cells Wet Prep HPF POC PRESENT (*)    WBC, Wet Prep HPF POC MANY (*)    All other components within normal limits  URINALYSIS, ROUTINE W REFLEX MICROSCOPIC (NOT AT [facility]) - Abnormal; Notable for the following:    APPearance CLOUDY (*)    Hgb urine dipstick SMALL (*)    Leukocytes, UA TRACE (*)    All other components within normal limits  URINE MICROSCOPIC-ADD ON - Abnormal; Notable for the following:    Squamous Epithelial / LPF 6-30 (*)    Bacteria, UA MANY (*)    All other components within normal limits  RPR  HIV ANTIBODY (ROUTINE TESTING)  I-STAT BETA HCG BLOOD, ED ([facility], [facility], [facility] ONLY)  GC/CHLAMYDIA PROBE AMP ([place]) NOT AT [facility]    Imaging Review No results found. I have personally reviewed and evaluated  these images and lab results as part of my medical decision-making.   EKG Interpretation None      MDM   Final diagnoses:  Yeast infection of the vagina  Trichomonal vaginitis    Patient left prep is positive for [place], trichomoniasis and clue cells. Patient treated here in the emergency department with Flagyl 2 g by mouth to treat for both. Trichomoniasis and BV. I have also treated the patient here with 150 minute milligrams of Diflucan. Patient informed of diagnosis and need to treat all sexual partners. She is a benign pelvic examination had no  concern for PID.Marland Kitchen She appears safe for discharge at this time    Arthor Captain, PA-C 04/14/15 1615  Arthor Captain, PA-C 04/14/15 1615  Jerelyn Scott, MD 04/15/15 [phone]

## 2015-04-14 NOTE — Discharge Instructions (Signed)
Monilial Vaginitis °Vaginitis in a soreness, swelling and redness (inflammation) of the vagina and vulva. Monilial vaginitis is not a sexually transmitted infection. °CAUSES  °Yeast vaginitis is caused by yeast (candida) that is normally found in your vagina. With a yeast infection, the candida has overgrown in number to a point that upsets the chemical balance. °SYMPTOMS  °· White, thick vaginal discharge. °· Swelling, itching, redness and irritation of the vagina and possibly the lips of the vagina (vulva). °· Burning or painful urination. °· Painful intercourse. °DIAGNOSIS  °Things that may contribute to monilial vaginitis are: °· Postmenopausal and virginal states. °· Pregnancy. °· Infections. °· Being tired, sick or stressed, especially if you had monilial vaginitis in the past. °· Diabetes. Good control will help lower the chance. °· Birth control pills. °· Tight fitting garments. °· Using bubble bath, feminine sprays, douches or deodorant tampons. °· Taking certain medications that kill germs (antibiotics). °· Sporadic recurrence can occur if you become ill. °TREATMENT  °Your caregiver will give you medication. °· There are several kinds of anti monilial vaginal creams and suppositories specific for monilial vaginitis. For recurrent yeast infections, use a suppository or cream in the vagina 2 times a week, or as directed. °· Anti-monilial or steroid cream for the itching or irritation of the vulva may also be used. Get your caregiver's permission. °· Painting the vagina with methylene blue solution may help if the monilial cream does not work. °· Eating yogurt may help prevent monilial vaginitis. °HOME CARE INSTRUCTIONS  °· Finish all medication as prescribed. °· Do not have sex until treatment is completed or after your caregiver tells you it is okay. °· Take warm sitz baths. °· Do not douche. °· Do not use tampons, especially scented ones. °· Wear cotton underwear. °· Avoid tight pants and panty  hose. °· Tell your sexual partner that you have a yeast infection. They should go to their caregiver if they have symptoms such as mild rash or itching. °· Your sexual partner should be treated as well if your infection is difficult to eliminate. °· Practice safer sex. Use condoms. °· Some vaginal medications cause latex condoms to fail. Vaginal medications that harm condoms are: °· Cleocin cream. °· Butoconazole (Femstat®). °· Terconazole (Terazol®) vaginal suppository. °· Miconazole (Monistat®) (may be purchased over the counter). °SEEK MEDICAL CARE IF:  °· You have a temperature by mouth above 102° F (38.9° C). °· The infection is getting worse after 2 days of treatment. °· The infection is not getting better after 3 days of treatment. °· You develop blisters in or around your vagina. °· You develop vaginal bleeding, and it is not your menstrual period. °· You have pain when you urinate. °· You develop intestinal problems. °· You have pain with sexual intercourse. °  °This information is not intended to replace advice given to you by your health care provider. Make sure you discuss any questions you have with your health care provider. °  °Document Released: 12/21/2004 Document Revised: 06/05/2011 Document Reviewed: 09/14/2014 °Elsevier Interactive Patient Education ©2016 Else[employer] Inc. °Trichomoniasis °Trichomoniasis is an infection caused by an organism called Trichomonas. The infection can affect both women and men. In women, the outer female genitalia and the vagina are affected. In men, the penis is mainly affected, but the prostate and other reproductive organs can also be involved. Trichomoniasis is a sexually transmitted infection (STI) and is most often passed to another person through sexual contact.  °RISK FACTORS °· Having unprotected sexual intercourse. °·   intercourse.  Having sexual intercourse with an infected partner. SIGNS AND SYMPTOMS  Symptoms of trichomoniasis in women include:  Abnormal gray-green  frothy vaginal discharge.  Itching and irritation of the vagina.  Itching and irritation of the area outside the vagina. Symptoms of trichomoniasis in men include:   Penile discharge with or without pain.  Pain during urination. This results from inflammation of the urethra. DIAGNOSIS  Trichomoniasis may be found during a Pap test or physical exam. Your health care provider may use one of the following methods to help diagnose this infection:  Testing the pH of the vagina with a test tape.  Using a vaginal swab test that checks for the Trichomonas organism. A test is available that provides results within a few minutes.  Examining a urine sample.  Testing vaginal secretions. Your health care provider may test you for other STIs, including HIV. TREATMENT   You may be given medicine to fight the infection. Women should inform their health care provider if they could be or are pregnant. Some medicines used to treat the infection should not be taken during pregnancy.  Your health care provider may recommend over-the-counter medicines or creams to decrease itching or irritation.  Your sexual partner will need to be treated if infected.  Your health care provider may test you for infection again 3 months after treatment. HOME CARE INSTRUCTIONS   Take medicines only as directed by your health care provider.  Take over-the-counter medicine for itching or irritation as directed by your health care provider.  Do not have sexual intercourse while you have the infection.  Women should not douche or wear tampons while they have the infection.  Discuss your infection with your partner. Your partner may have gotten the infection from you, or you may have gotten it from your partner.  Have your sex partner get examined and treated if necessary.  Practice safe, informed, and protected sex.  See your health care provider for other STI testing. SEEK MEDICAL CARE IF:   You still have  symptoms after you finish your medicine.  You develop abdominal pain.  You have pain when you urinate.  You have bleeding after sexual intercourse.  You develop a rash.  Your medicine makes you sick or makes you throw up (vomit). MAKE SURE YOU:  Understand these instructions.  Will watch your condition.  Will get help right away if you are not doing well or get worse.   This information is not intended to replace advice given to you by your health care provider. Make sure you discuss any questions you have with your health care provider.   Document Released: 09/06/2000 Document Revised: 04/03/2014 Document Reviewed: 12/23/2012 Elsevier Interactive Patient Education [employer]2016 [employer] Inc.

## 2015-04-15 LAB — GC/CHLAMYDIA PROBE AMP (~~LOC~~) NOT AT ARMC
CHLAMYDIA, DNA PROBE: NEGATIVE
Neisseria Gonorrhea: NEGATIVE

## 2015-04-15 LAB — HIV ANTIBODY (ROUTINE TESTING W REFLEX): HIV SCREEN 4TH GENERATION: NONREACTIVE

## 2015-04-15 LAB — RPR: RPR Ser Ql: NONREACTIVE

## 2017-04-19 ENCOUNTER — Encounter (HOSPITAL_COMMUNITY): Payer: Self-pay | Admitting: Emergency Medicine

## 2017-04-19 ENCOUNTER — Emergency Department (HOSPITAL_COMMUNITY): Payer: Medicaid Other

## 2017-04-19 ENCOUNTER — Emergency Department (HOSPITAL_COMMUNITY)
Admission: EM | Admit: 2017-04-19 | Discharge: 2017-04-19 | Disposition: A | Discharge status: Home / Self Care | Payer: Medicaid Other | Attending: Emergency Medicine | Admitting: Emergency Medicine

## 2017-04-19 DIAGNOSIS — R69 Illness, unspecified: Secondary | ICD-10-CM

## 2017-04-19 DIAGNOSIS — J111 Influenza due to unidentified influenza virus with other respiratory manifestations: Secondary | ICD-10-CM | POA: Insufficient documentation

## 2017-04-19 DIAGNOSIS — Z79899 Other long term (current) drug therapy: Secondary | ICD-10-CM | POA: Insufficient documentation

## 2017-04-19 DIAGNOSIS — F17210 Nicotine dependence, cigarettes, uncomplicated: Secondary | ICD-10-CM | POA: Insufficient documentation

## 2017-04-19 LAB — COMPREHENSIVE METABOLIC PANEL
ALT: 17 U/L (ref 14–54)
AST: 18 U/L (ref 15–41)
Albumin: 4.3 g/dL (ref 3.5–5.0)
Alkaline Phosphatase: 50 U/L (ref 38–126)
Anion gap: 7 (ref 5–15)
BUN: 11 mg/dL (ref 6–20)
CO2: 27 mmol/L (ref 22–32)
Calcium: 9.3 mg/dL (ref 8.9–10.3)
Chloride: 103 mmol/L (ref 101–111)
Creatinine, Ser: 0.86 mg/dL (ref 0.44–1.00)
GFR calc Af Amer: >60 mL/min (ref >60)
GFR calc non Af Amer: >60 mL/min (ref >60)
Glucose, Bld: 91 mg/dL (ref 65–99)
Potassium: 3.8 mmol/L (ref 3.5–5.1)
Sodium: 137 mmol/L (ref 135–145)
Total Bilirubin: 0.4 mg/dL (ref 0.3–1.2)
Total Protein: 7.8 g/dL (ref 6.5–8.1)

## 2017-04-19 LAB — CBC
HCT: 36.4 % (ref 36.0–46.0)
Hemoglobin: 12 g/dL (ref 12.0–15.0)
MCH: 32.3 pg (ref 26.0–34.0)
MCHC: 33 g/dL (ref 30.0–36.0)
MCV: 97.8 fL (ref 78.0–100.0)
Platelets: 295 10*3/uL (ref 150–400)
RBC: 3.72 MIL/uL — ABNORMAL LOW (ref 3.87–5.11)
RDW: 12.3 % (ref 11.5–15.5)
WBC: 6.1 10*3/uL (ref 4.0–10.5)

## 2017-04-19 LAB — I-STAT BETA HCG BLOOD, ED (MC, WL, AP ONLY): I-stat hCG, quantitative: <5 m[IU]/mL (ref <5)

## 2017-04-19 LAB — LIPASE, BLOOD: Lipase: 23 U/L (ref 11–51)

## 2017-04-19 MED ORDER — KETOROLAC TROMETHAMINE 15 MG/ML IJ SOLN
15.0000 mg | Freq: Once | INTRAMUSCULAR | Status: AC
Start: 2017-04-19 — End: 2017-04-19
  Administered 2017-04-19: 15 mg via INTRAVENOUS
  Filled 2017-04-19: qty 1

## 2017-04-19 MED ORDER — FLUTICASONE PROPIONATE 50 MCG/ACT NA SUSP: 1.0000 | Freq: Every day | NASAL | 2 refills | Status: DC

## 2017-04-19 MED ORDER — BENZONATATE 100 MG PO CAPS: 100.0000 mg | ORAL_CAPSULE | Freq: Three times a day (TID) | ORAL | 0 refills | Status: DC

## 2017-04-19 MED ORDER — ONDANSETRON 4 MG PO TBDP: 4.0000 mg | ORAL_TABLET | Freq: Three times a day (TID) | ORAL | 0 refills | Status: DC | PRN

## 2017-04-19 MED ORDER — NAPROXEN 500 MG PO TABS: 500.0000 mg | ORAL_TABLET | Freq: Two times a day (BID) | ORAL | 0 refills | Status: DC

## 2017-04-19 MED ORDER — SODIUM CHLORIDE 0.9 % IV BOLUS (SEPSIS)
500.0000 mL | Freq: Once | INTRAVENOUS | Status: AC
  Administered 2017-04-19: 500 mL via INTRAVENOUS

## 2017-04-19 MED ORDER — ACETAMINOPHEN 325 MG PO TABS
650.0000 mg | ORAL_TABLET | Freq: Once | ORAL | Status: AC
  Administered 2017-04-19: 650 mg via ORAL
  Filled 2017-04-19: qty 2

## 2017-04-19 MED ORDER — OSELTAMIVIR PHOSPHATE 75 MG PO CAPS: 75.0000 mg | ORAL_CAPSULE | Freq: Two times a day (BID) | ORAL | 0 refills | Status: DC

## 2017-04-19 NOTE — ED Triage Notes (Signed)
Patient here from home with complaints of flu like symptoms. Productive cough, nausea, vomiting x3 since yesterday evening.

## 2017-04-19 NOTE — ED Provider Notes (Signed)
[facility]-EMERGENCY DEPT Provider Note   CSN: [id number] Arrival date & time: 04/19/17  1546     History   Chief Complaint Chief Complaint  Patient presents with  . Generalized Body Aches  . Cough  . Nausea  . Emesis    HPI [name] is a 37 y.o. female with no significant past medical history, who presents to ED for evaluation of flulike symptoms that began yesterday.  She reports productive cough, subjective fever, nausea, nasal congestion, sore throat, generalized body aches.  She has not taken any medications prior to arrival to help with her symptoms.  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  She did not receive her influenza vaccine this year.  She denies any chest pain, abdominal pain, vomiting, vision changes, neck pain, injuries or falls.  HPI  History reviewed. No pertinent past medical history.  There are no active problems to display for this patient.   Past Surgical History:  Procedure Laterality Date  . CESAREAN SECTION      OB History    No data available       Home Medications    Prior to Admission medications   Medication Sig Start Date End Date Taking? Authorizing Provider  benzonatate (TESSALON) 100 MG capsule Take 1 capsule (100 mg total) by mouth every 8 (eight) hours. 04/19/17   Wyona Neils, PA-C  fluticasone (FLONASE) 50 MCG/ACT nasal spray Place 1 spray into both nostrils daily. 04/19/17   Makinna Andy, PA-C  ketoconazole (NIZORAL) 2 % cream Apply to the external portions of the vagina 2 x a day. Patient not taking: Reported on 04/19/2017 04/14/15   Arthor Captain, PA-C  naproxen (NAPROSYN) 500 MG tablet Take 1 tablet (500 mg total) by mouth 2 (two) times daily. 04/19/17   Zarius Furr, PA-C  ondansetron (ZOFRAN ODT) 4 MG disintegrating tablet Take 1 tablet (4 mg total) by mouth every 8 (eight) hours as needed for nausea or vomiting. 04/19/17    Siler Mavis, PA-C  oseltamivir (TAMIFLU) 75 MG capsule Take 1 capsule (75 mg total) by mouth every 12 (twelve) hours. 04/19/17   Dietrich Pates, PA-C    Family History No family history on file.  Social History Social History   Tobacco Use  . Smoking status: Current Every Day Smoker    Packs/day: 1.50    Types: Cigarettes  . Smokeless tobacco: Never Used  Substance Use Topics  . Alcohol use: Yes    Comment: socially  . Drug use: No     Allergies   Benadryl [diphenhydramine hcl]   Review of Systems Review of Systems  Constitutional: Positive for fatigue. Negative for appetite change, chills and fever.  HENT: Positive for congestion, postnasal drip, rhinorrhea and sore throat. Negative for dental problem, drooling, ear pain, mouth sores, sneezing and trouble swallowing.   Eyes: Negative for photophobia and visual disturbance.  Respiratory: Positive for cough. Negative for chest tightness, shortness of breath and wheezing.   Cardiovascular: Negative for chest pain and palpitations.  Gastrointestinal: Positive for nausea. Negative for abdominal pain, blood in stool, constipation, diarrhea and vomiting.  Endocrine: Negative for polyphagia and polyuria.  Genitourinary: Negative for dysuria, hematuria and urgency.  Musculoskeletal: Positive for myalgias.  Skin: Negative for rash.  Neurological: Negative for dizziness, weakness and light-headedness.     Physical Exam Updated Vital Signs BP 113/61   Pulse 80   Temp (!) 100.7  F (38.2 C) (Oral)   Resp 18   LMP 04/16/2017   SpO2 97%   Physical Exam  Constitutional: She appears well-developed and well-nourished. No distress.  HENT:  Head: Normocephalic and atraumatic.  Right Ear: Tympanic membrane normal.  Left Ear: Tympanic membrane normal.  Nose: Rhinorrhea present.  Mouth/Throat: Posterior oropharyngeal erythema present. No tonsillar exudate.  Patient does not appear to be in acute distress. No trismus or drooling  present. No pooling of secretions. Patient is tolerating secretions and is not in respiratory distress. No neck pain or tenderness to palpation of the neck. Full active and passive range of motion of the neck. No evidence of RPA or PTA.  Eyes: Conjunctivae and EOM are normal. Left eye exhibits no discharge. No scleral icterus.  Neck: Normal range of motion. Neck supple.  Cardiovascular: Normal rate, regular rhythm, normal heart sounds and intact distal pulses. Exam reveals no gallop and no friction rub.  No murmur heard. Pulmonary/Chest: Effort normal and breath sounds normal. No respiratory distress.  Abdominal: Soft. Bowel sounds are normal. She exhibits no distension. There is no tenderness. There is no guarding.  Musculoskeletal: Normal range of motion. She exhibits no edema.  Neurological: She is alert. She exhibits normal muscle tone. Coordination normal.  Skin: Skin is warm and dry. No rash noted.  Psychiatric: She has a normal mood and affect.  Nursing note and vitals reviewed.    ED Treatments / Results  Labs (all labs ordered are listed, but only abnormal results are displayed) Labs Reviewed  CBC - Abnormal; Notable for the following components:      Result Value   RBC 3.72 (*)    All other components within normal limits  LIPASE, BLOOD  COMPREHENSIVE METABOLIC PANEL  I-STAT BETA HCG BLOOD, ED ([facility], [facility], [facility] ONLY)    EKG  EKG Interpretation None       Radiology Dg Chest 2 View  Result Date: 04/19/2017 CLINICAL DATA:  37 year old with cough and sore throat. Current smoker. EXAM: CHEST  2 VIEW COMPARISON:  05/11/2003 FINDINGS: The heart size and mediastinal contours are within normal limits. Both lungs are clear. The visualized skeletal structures are unremarkable. IMPRESSION: No active cardiopulmonary disease. Electronically Signed   By: Richarda Overlie M.D.   On: 04/19/2017 17:24    Procedures Procedures (including critical care time)  Medications Ordered in  ED Medications  sodium chloride 0.9 % bolus 500 mL (500 mLs Intravenous New Bag/Given 04/19/17 2142)  ketorolac (TORADOL) 15 MG/ML injection 15 mg (15 mg Intravenous Given 04/19/17 2142)  acetaminophen (TYLENOL) tablet 650 mg (650 mg Oral Given 04/19/17 2142)     Initial Impression / Assessment and Plan / ED Course  I have reviewed the triage vital signs and the nursing notes.  Pertinent labs & imaging results that were available during my care of the patient were reviewed by me and considered in my medical decision making (see chart for details).     Patient presents to ED for evaluation of flulike symptoms since yesterday.  She reports productive cough, subjective fever, nausea, nasal congestion, sore throat and generalized body aches.  She not take any medications prior to arrival to help with her symptoms.  Unknown sick contacts with similar symptoms.  Denies any chest pain, abdominal pain, vomiting.  No signs of RPA or PTA on examination of posterior oropharynx and she is tolerating secretions with no signs of respiratory distress or airway compromise.  Mildly febrile to 100.7 here in the ED.  Other vital signs are unremarkable.  Lab work including CBC, lipase, CMP, hCG unremarkable.  Chest x-ray with no acute abnormalities.  I suspect possible influenza or influenza-like illness as a cause of her symptoms.  Her temperature has improved with the use of antipyretics here to 98.5.  Since she is still in the window for treatment, will give Tamiflu but advised on risks and benefits.  We will also give other symptomatic treatment with antitussives, antiemetics, Flonase and naproxen to be taken as needed.  Patient appears stable for discharge at this time.  Strict return precautions given.  Final Clinical Impressions(s) / ED Diagnoses   Final diagnoses:  Influenza-like illness    ED Discharge Orders        Ordered    oseltamivir (TAMIFLU) 75 MG capsule  Every 12 hours     04/19/17 2246     benzonatate (TESSALON) 100 MG capsule  Every 8 hours     04/19/17 2246    naproxen (NAPROSYN) 500 MG tablet  2 times daily     04/19/17 2246    fluticasone (FLONASE) 50 MCG/ACT nasal spray  Daily     04/19/17 2246    ondansetron (ZOFRAN ODT) 4 MG disintegrating tablet  Every 8 hours PRN     04/19/17 2246     Portions of this note were generated with Dragon dictation software. Dictation errors may occur despite best attempts at proofreading.    Dietrich PatesKhatri, Elianne Gubser, PA-C 04/19/17 2252    Raeford RazorKohut, Stephen, MD 04/21/17 1642

## 2017-04-19 NOTE — Discharge Instructions (Signed)
Please read attached information regarding your condition. Take Tamiflu beginning today to help with your flulike symptoms.  You may experience side effects such as diarrhea. Take Tessalon Perles as needed for cough.  Take Flonase as needed for nasal congestion.  Take naproxen as needed for body aches.  Take Zofran as needed for nausea. Push fluids to maintain adequate hydration and slowly advance her diet. Return to ED for worsening symptoms, chest pain, shortness of breath, severe abdominal pain, increased vomiting, lightheadedness or loss of consciousness.

## 2017-05-08 ENCOUNTER — Emergency Department (HOSPITAL_COMMUNITY)
Admission: EM | Admit: 2017-05-08 | Discharge: 2017-05-09 | Disposition: A | Discharge status: Home / Self Care | Payer: Medicaid Other | Attending: Emergency Medicine | Admitting: Emergency Medicine

## 2017-05-08 ENCOUNTER — Encounter (HOSPITAL_COMMUNITY): Payer: Self-pay

## 2017-05-08 DIAGNOSIS — R05 Cough: Secondary | ICD-10-CM | POA: Insufficient documentation

## 2017-05-08 DIAGNOSIS — Z79899 Other long term (current) drug therapy: Secondary | ICD-10-CM | POA: Insufficient documentation

## 2017-05-08 DIAGNOSIS — R69 Illness, unspecified: Secondary | ICD-10-CM

## 2017-05-08 DIAGNOSIS — F17210 Nicotine dependence, cigarettes, uncomplicated: Secondary | ICD-10-CM | POA: Insufficient documentation

## 2017-05-08 DIAGNOSIS — R197 Diarrhea, unspecified: Secondary | ICD-10-CM | POA: Insufficient documentation

## 2017-05-08 DIAGNOSIS — J111 Influenza due to unidentified influenza virus with other respiratory manifestations: Secondary | ICD-10-CM | POA: Insufficient documentation

## 2017-05-08 DIAGNOSIS — B9789 Other viral agents as the cause of diseases classified elsewhere: Secondary | ICD-10-CM

## 2017-05-08 DIAGNOSIS — J069 Acute upper respiratory infection, unspecified: Secondary | ICD-10-CM

## 2017-05-08 LAB — COMPREHENSIVE METABOLIC PANEL
ALK PHOS: 53 U/L (ref 38–126)
ALT: 15 U/L (ref 14–54)
ANION GAP: 11 (ref 5–15)
AST: 15 U/L (ref 15–41)
Albumin: 3.8 g/dL (ref 3.5–5.0)
BILIRUBIN TOTAL: 0.8 mg/dL (ref 0.3–1.2)
BUN: 9 mg/dL (ref 6–20)
CALCIUM: 9.1 mg/dL (ref 8.9–10.3)
CO2: 21 mmol/L — ABNORMAL LOW (ref 22–32)
CREATININE: 0.9 mg/dL (ref 0.44–1.00)
Chloride: 102 mmol/L (ref 101–111)
Glucose, Bld: 100 mg/dL — ABNORMAL HIGH (ref 65–99)
Potassium: 3.9 mmol/L (ref 3.5–5.1)
Sodium: 134 mmol/L — ABNORMAL LOW (ref 135–145)
TOTAL PROTEIN: 7.5 g/dL (ref 6.5–8.1)

## 2017-05-08 LAB — URINALYSIS, ROUTINE W REFLEX MICROSCOPIC
Bilirubin Urine: NEGATIVE
GLUCOSE, UA: NEGATIVE mg/dL
Ketones, ur: NEGATIVE mg/dL
NITRITE: NEGATIVE
PH: 5 (ref 5.0–8.0)
PROTEIN: NEGATIVE mg/dL
Specific Gravity, Urine: 1.02 (ref 1.005–1.030)

## 2017-05-08 LAB — CBC
HCT: 37.2 % (ref 36.0–46.0)
HEMOGLOBIN: 12.3 g/dL (ref 12.0–15.0)
MCH: 32.2 pg (ref 26.0–34.0)
MCHC: 33.1 g/dL (ref 30.0–36.0)
MCV: 97.4 fL (ref 78.0–100.0)
PLATELETS: 257 10*3/uL (ref 150–400)
RBC: 3.82 MIL/uL — AB (ref 3.87–5.11)
RDW: 12.4 % (ref 11.5–15.5)
WBC: 8.8 10*3/uL (ref 4.0–10.5)

## 2017-05-08 LAB — HCG, QUANTITATIVE, PREGNANCY

## 2017-05-08 LAB — I-STAT BETA HCG BLOOD, ED (MC, WL, AP ONLY): I-stat hCG, quantitative: 31.7 m[IU]/mL — ABNORMAL HIGH (ref <5)

## 2017-05-08 LAB — RAPID STREP SCREEN (MED CTR MEBANE ONLY): STREPTOCOCCUS, GROUP A SCREEN (DIRECT): NEGATIVE

## 2017-05-08 LAB — LIPASE, BLOOD: Lipase: 22 U/L (ref 11–51)

## 2017-05-08 MED ORDER — IBUPROFEN 800 MG PO TABS: 800.0000 mg | ORAL_TABLET | Freq: Once | ORAL | Status: DC

## 2017-05-08 MED ORDER — ONDANSETRON HCL 4 MG/2ML IJ SOLN: 4.0000 mg | Freq: Once | INTRAMUSCULAR | Status: DC

## 2017-05-08 MED ORDER — PROMETHAZINE-DM 6.25-15 MG/5ML PO SYRP: 5.0000 mL | ORAL_SOLUTION | Freq: Four times a day (QID) | ORAL | 0 refills | Status: DC | PRN

## 2017-05-08 MED ORDER — SODIUM CHLORIDE 0.9 % IV BOLUS (SEPSIS)
1000.0000 mL | Freq: Once | INTRAVENOUS | Status: AC
  Administered 2017-05-08: 1000 mL via INTRAVENOUS

## 2017-05-08 MED ORDER — GUAIFENESIN ER 1200 MG PO TB12: 1.0000 | ORAL_TABLET | Freq: Two times a day (BID) | ORAL | 0 refills | Status: DC

## 2017-05-08 MED ORDER — ACETAMINOPHEN 500 MG PO TABS: 1000.0000 mg | ORAL_TABLET | Freq: Once | ORAL | Status: DC

## 2017-05-08 MED ORDER — IBUPROFEN 800 MG PO TABS: 800.0000 mg | ORAL_TABLET | Freq: Three times a day (TID) | ORAL | 0 refills | Status: DC | PRN

## 2017-05-08 NOTE — ED Triage Notes (Signed)
Pt presents with 3 day h/o nausea, vomiting and diarrhea.  Pt was seen here 2 weeks ago, given medication for the flu, she reports she got better and became ill x 3 days ago.  Pt reports 101.9 fever last night.  Pt works in a nursing home.

## 2017-05-08 NOTE — Discharge Instructions (Signed)
Return here as needed.  Follow-up with your primary doctor.  Increase her fluid intake and rest as much as possible.  Do not return to work until you had no fever for 24 hours.

## 2017-05-08 NOTE — ED Provider Notes (Signed)
[facility]   CSN: [id number] Arrival date & time: 05/08/17  1535     History   Chief Complaint No chief complaint on file.   HPI [name] is a 37 y.o. female.  HPI Patient presents to the emergency department with nausea vomiting diarrhea sore throat and cough with body aches with fever over the last 2 days.  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  The patient states that she was seen recently within the last 2 weeks for an influenza-like illness.  Patient states she did not take any medications prior to arrival.  The patient denies chest pain, shortness of breath, headache,blurred vision, neck pain, weakness, numbness, dizziness, anorexia, edema, abdominal pain, nausea, vomiting, diarrhea, rash, back pain, dysuria, hematemesis, bloody stool, near syncope, or syncope. History reviewed. No pertinent past medical history.  There are no active problems to display for this patient.   Past Surgical History:  Procedure Laterality Date  . CESAREAN SECTION      OB History    No data available       Home Medications    Prior to Admission medications   Medication Sig Start Date End Date Taking? Authorizing Provider  benzonatate (TESSALON) 100 MG capsule Take 1 capsule (100 mg total) by mouth every 8 (eight) hours. 04/19/17  Yes Khatri, Hina, PA-C  naproxen (NAPROSYN) 500 MG tablet Take 1 tablet (500 mg total) by mouth 2 (two) times daily. 04/19/17  Yes Khatri, Hina, PA-C  oseltamivir (TAMIFLU) 75 MG capsule Take 1 capsule (75 mg total) by mouth every 12 (twelve) hours. 04/19/17  Yes Khatri, Hina, PA-C  fluticasone (FLONASE) 50 MCG/ACT nasal spray Place 1 spray into both nostrils daily. Patient not taking: Reported on 05/08/2017 04/19/17   Dietrich PatesKhatri, Hina, PA-C  ketoconazole (NIZORAL) 2 % cream Apply to the external portions of the vagina 2 x a day. Patient not  taking: Reported on 04/19/2017 04/14/15   Arthor CaptainHarris, Abigail, PA-C  ondansetron (ZOFRAN ODT) 4 MG disintegrating tablet Take 1 tablet (4 mg total) by mouth every 8 (eight) hours as needed for nausea or vomiting. Patient not taking: Reported on 05/08/2017 04/19/17   Dietrich PatesKhatri, Hina, PA-C    Family History History reviewed. No pertinent family history.  Social History Social History   Tobacco Use  . Smoking status: Current Every Day Smoker    Packs/day: 1.50    Types: Cigarettes  . Smokeless tobacco: Never Used  Substance Use Topics  . Alcohol use: Yes    Comment: socially  . Drug use: No     Allergies   Benadryl [diphenhydramine hcl]   Review of Systems Review of Systems All other systems negative except as documented in the HPI. All pertinent positives and negatives as reviewed in the HPI.  Physical Exam Updated Vital Signs BP (!) 113/58 (BP Location: Right Arm)   Pulse 91   Temp 98.9 F (37.2 C) (Oral)   Resp 18   Ht 4\' 11"  (1.499 m)   Wt 86.6 kg (191 lb)   LMP 04/16/2017   SpO2 100%   BMI 38.58 kg/m   Physical Exam  Constitutional: She is oriented to person, place, and time. She appears well-developed and well-nourished. No distress.  HENT:  Head: Normocephalic and atraumatic.  Mouth/Throat: Oropharynx is clear and moist.  Eyes: Pupils are equal, round, and reactive to light.  Neck: Normal range of motion.  Neck supple.  Cardiovascular: Normal rate, regular rhythm and normal heart sounds. Exam reveals no gallop and no friction rub.  No murmur heard. Pulmonary/Chest: Effort normal and breath sounds normal. No respiratory distress. She has no wheezes.  Abdominal: Soft. Bowel sounds are normal. She exhibits no distension. There is no tenderness.  Neurological: She is alert and oriented to person, place, and time. She exhibits normal muscle tone. Coordination normal.  Skin: Skin is warm and dry. Capillary refill takes less than 2 seconds. No rash noted. No erythema.    Psychiatric: She has a normal mood and affect. Her behavior is normal.  Nursing note and vitals reviewed.    ED Treatments / Results  Labs (all labs ordered are listed, but only abnormal results are displayed) Labs Reviewed  COMPREHENSIVE METABOLIC PANEL - Abnormal; Notable for the following components:      Result Value   Sodium 134 (*)    CO2 21 (*)    Glucose, Bld 100 (*)    All other components within normal limits  CBC - Abnormal; Notable for the following components:   RBC 3.82 (*)    All other components within normal limits  URINALYSIS, ROUTINE W REFLEX MICROSCOPIC - Abnormal; Notable for the following components:   Color, Urine AMBER (*)    APPearance CLOUDY (*)    Hgb urine dipstick MODERATE (*)    Leukocytes, UA SMALL (*)    Bacteria, UA MANY (*)    Squamous Epithelial / LPF TOO NUMEROUS TO COUNT (*)    All other components within normal limits  I-STAT BETA HCG BLOOD, ED ([facility], [facility], [facility] ONLY) - Abnormal; Notable for the following components:   I-stat hCG, quantitative 31.7 (*)    All other components within normal limits  RAPID STREP SCREEN (NOT AT [facility])  CULTURE, GROUP A STREP ([facility])  LIPASE, BLOOD  HCG, QUANTITATIVE, PREGNANCY    EKG  EKG Interpretation None       Radiology No results found.  Procedures Procedures (including critical care time)  Medications Ordered in ED Medications  ondansetron (ZOFRAN) injection 4 mg (not administered)  sodium chloride 0.9 % bolus 1,000 mL (1,000 mLs Intravenous New Bag/Given 05/08/17 2217)     Initial Impression / Assessment and Plan / ED Course  I have reviewed the triage vital signs and the nursing notes.  Pertinent labs & imaging results that were available during my care of the patient were reviewed by me and considered in my medical decision making (see chart for details).     Patient has a viral type illness.  Patient will be given symptomatic relief of her symptoms.  The patient is advised to increase  her fluid intake and rest as much as possible.  I have asked the patient to return for any worsening in her condition patient agrees the plan and all questions were answered  Final Clinical Impressions(s) / ED Diagnoses   Final diagnoses:  None    ED Discharge Orders    None       Charlestine Night, PA-C 05/08/17 2331    Margarita Grizzle, MD 05/12/17 [phone]

## 2017-05-10 LAB — CULTURE, GROUP A STREP (THRC)

## 2018-06-03 ENCOUNTER — Emergency Department (HOSPITAL_COMMUNITY)
Admission: EM | Admit: 2018-06-03 | Discharge: 2018-06-03 | Disposition: A | Discharge status: Home / Self Care | Payer: Self-pay | Attending: Emergency Medicine | Admitting: Emergency Medicine

## 2018-06-03 ENCOUNTER — Emergency Department (HOSPITAL_COMMUNITY): Payer: Self-pay

## 2018-06-03 ENCOUNTER — Other Ambulatory Visit: Payer: Self-pay

## 2018-06-03 ENCOUNTER — Encounter (HOSPITAL_COMMUNITY): Payer: Self-pay

## 2018-06-03 DIAGNOSIS — F17210 Nicotine dependence, cigarettes, uncomplicated: Secondary | ICD-10-CM | POA: Insufficient documentation

## 2018-06-03 DIAGNOSIS — M25562 Pain in left knee: Secondary | ICD-10-CM | POA: Insufficient documentation

## 2018-06-03 MED ORDER — MELOXICAM 15 MG PO TABS: 15.0000 mg | ORAL_TABLET | Freq: Every day | ORAL | 0 refills | Status: DC

## 2018-06-03 NOTE — ED Triage Notes (Signed)
Pt reports pain and swelling in left knee x2 weeks. Pt reports feeling some tingling in her left leg and arm intermittently. Pt states that her left leg will randomly give out.

## 2018-06-03 NOTE — ED Notes (Signed)
Patient transported to X-ray 

## 2018-06-03 NOTE — Discharge Instructions (Signed)
Get help right away if: Your knee swells, and the swelling becomes worse. You cannot move your knee. You have severe pain in your knee. 

## 2018-06-03 NOTE — ED Provider Notes (Signed)
[facility]-EMERGENCY DEPT Provider Note   CSN: [id number] Arrival date & time: 06/03/18  1904    History   Chief Complaint Chief Complaint  Patient presents with  . Knee Pain    HPI [name] is a 38 y.o. female. Who presents the emergency department chief complaint of left knee pain.  Patient states she had onset of symptoms about 3 weeks ago.  She states that it feels swollen.  She has mechanical symptoms that include feelings of giving way, she feels crepitus in the knee.  She has no previous history of injury swelling or pain.  She denies fevers, chills.  He has not taken anything for the pain    HPI  History reviewed. No pertinent past medical history.  There are no active problems to display for this patient.   Past Surgical History:  Procedure Laterality Date  . CESAREAN SECTION       OB History   No obstetric history on file.      Home Medications    Prior to Admission medications   Medication Sig Start Date End Date Taking? Authorizing Provider  benzonatate (TESSALON) 100 MG capsule Take 1 capsule (100 mg total) by mouth every 8 (eight) hours. 04/19/17   Khatri, Hina, PA-C  fluticasone (FLONASE) 50 MCG/ACT nasal spray Place 1 spray into both nostrils daily. Patient not taking: Reported on 05/08/2017 04/19/17   Dietrich Pates, PA-C  Guaifenesin 1200 MG TB12 Take 1 tablet (1,200 mg total) by mouth 2 (two) times daily. 05/08/17   Lawyer, Cristal Deer, PA-C  ibuprofen (ADVIL,MOTRIN) 800 MG tablet Take 1 tablet (800 mg total) by mouth every 8 (eight) hours as needed. 05/08/17   Lawyer, Cristal Deer, PA-C  ketoconazole (NIZORAL) 2 % cream Apply to the external portions of the vagina 2 x a day. Patient not taking: Reported on 04/19/2017 04/14/15   Arthor Captain, PA-C  meloxicam (MOBIC) 15 MG tablet Take 1 tablet (15 mg total) by mouth daily. 06/03/18   Yareliz Thorstenson, PA-C  naproxen (NAPROSYN) 500 MG tablet Take 1 tablet (500 mg total) by mouth 2  (two) times daily. 04/19/17   Khatri, Hina, PA-C  ondansetron (ZOFRAN ODT) 4 MG disintegrating tablet Take 1 tablet (4 mg total) by mouth every 8 (eight) hours as needed for nausea or vomiting. Patient not taking: Reported on 05/08/2017 04/19/17   Dietrich Pates, PA-C  oseltamivir (TAMIFLU) 75 MG capsule Take 1 capsule (75 mg total) by mouth every 12 (twelve) hours. 04/19/17   Khatri, Hina, PA-C  promethazine-dextromethorphan (PROMETHAZINE-DM) 6.25-15 MG/5ML syrup Take 5 mLs by mouth 4 (four) times daily as needed for cough. 05/08/17   Charlestine Night, PA-C    Family History History reviewed. No pertinent family history.  Social History Social History   Tobacco Use  . Smoking status: Current Every Day Smoker    Packs/day: 1.50    Types: Cigarettes  . Smokeless tobacco: Never Used  Substance Use Topics  . Alcohol use: Yes    Comment: socially  . Drug use: No     Allergies   Benadryl [diphenhydramine hcl]   Review of Systems Review of Systems Ten systems reviewed and are negative for acute change, except as noted in the HPI.    Physical Exam Updated Vital Signs BP 121/66 (BP Location: Left Arm)   Pulse 79   Temp 99.4 F (37.4 C) (Oral)   Resp 16   LMP 05/25/2018   SpO2 99%   Physical Exam  Physical Exam  Nursing note and vitals reviewed. Constitutional: She is oriented to person, place, and time. She appears well-developed and well-nourished. No distress.  HENT:  Head: Normocephalic and atraumatic.  Eyes: Conjunctivae normal and EOM are normal. Pupils are equal, round, and reactive to light. No scleral icterus.  Neck: Normal range of motion.  Cardiovascular: Normal rate, regular rhythm and normal heart sounds.  Exam reveals no gallop and no friction rub.   No murmur heard. Pulmonary/Chest: Effort normal and breath sounds normal. No respiratory distress.  Abdominal: Soft. Bowel sounds are normal. She exhibits no distension and no mass. There is no tenderness. There  is no guarding.  Neurological: She is alert and oriented to person, place, and time. Musculoskeletal: Left knee with mild effusion, tender palpation in the suprapatellar region, no erythema heat or warmth warmth.  Pain with deep flexion and full extension of the knee, ligaments are stable, negative anterior posterior drawer.  No crepitus with movement.  Skin: Skin is warm and dry. She is not diaphoretic.    ED Treatments / Results  Labs (all labs ordered are listed, but only abnormal results are displayed) Labs Reviewed - No data to display  EKG None  Radiology Dg Knee Complete 4 Views Left  Result Date: 06/03/2018 CLINICAL DATA:  Left knee pain for 2 weeks. No known injury. Difficulty bearing weight. EXAM: LEFT KNEE - COMPLETE 4+ VIEW COMPARISON:  None. FINDINGS: No evidence of fracture, dislocation, or joint effusion. No evidence of arthropathy or other focal bone abnormality. Soft tissues are unremarkable. IMPRESSION: Negative. Electronically Signed   By: Burman Nieves M.D.   On: 06/03/2018 21:44    Procedures Procedures (including critical care time)  Medications Ordered in ED Medications - No data to display   Initial Impression / Assessment and Plan / ED Course  I have reviewed the triage vital signs and the nursing notes.  Pertinent labs & imaging results that were available during my care of the patient were reviewed by me and considered in my medical decision making (see chart for details).        Patient X-Ray negative for obvious fracture or dislocation. Pain managed in ED. Pt advised to follow up with orthopedics if symptoms persist for possibility of missed fracture diagnosis. Patient given brace while in ED, conservative therapy recommended and discussed. Patient will be dc home & is agreeable with above plan.   Final Clinical Impressions(s) / ED Diagnoses   Final diagnoses:  Acute pain of left knee    ED Discharge Orders         Ordered    meloxicam  (MOBIC) 15 MG tablet  Daily     06/03/18 2218           Arthor Captain, PA-C 06/03/18 2233    Vanetta Mulders, MD 06/04/18 [phone]

## 2018-07-21 ENCOUNTER — Encounter (HOSPITAL_COMMUNITY): Payer: Self-pay | Admitting: Emergency Medicine

## 2018-07-21 ENCOUNTER — Emergency Department (HOSPITAL_COMMUNITY): Payer: Medicaid Other

## 2018-07-21 ENCOUNTER — Emergency Department (HOSPITAL_COMMUNITY)
Admission: EM | Admit: 2018-07-21 | Discharge: 2018-07-22 | Disposition: A | Discharge status: Home / Self Care | Payer: Medicaid Other | Attending: Emergency Medicine | Admitting: Emergency Medicine

## 2018-07-21 ENCOUNTER — Other Ambulatory Visit: Payer: Self-pay

## 2018-07-21 DIAGNOSIS — F17210 Nicotine dependence, cigarettes, uncomplicated: Secondary | ICD-10-CM | POA: Insufficient documentation

## 2018-07-21 DIAGNOSIS — R091 Pleurisy: Secondary | ICD-10-CM | POA: Insufficient documentation

## 2018-07-21 MED ORDER — PREDNISONE 20 MG PO TABS
60.0000 mg | ORAL_TABLET | Freq: Once | ORAL | Status: AC
  Administered 2018-07-21: 60 mg via ORAL
  Filled 2018-07-21: qty 3

## 2018-07-21 MED ORDER — IBUPROFEN 800 MG PO TABS: 800.0000 mg | ORAL_TABLET | Freq: Three times a day (TID) | ORAL | 0 refills | Status: DC

## 2018-07-21 MED ORDER — ALBUTEROL SULFATE (2.5 MG/3ML) 0.083% IN NEBU: 5.0000 mg | INHALATION_SOLUTION | Freq: Once | RESPIRATORY_TRACT | Status: DC

## 2018-07-21 MED ORDER — PREDNISONE 20 MG PO TABS: ORAL_TABLET | ORAL | 0 refills | Status: DC

## 2018-07-21 MED ORDER — KETOROLAC TROMETHAMINE 60 MG/2ML IM SOLN
30.0000 mg | Freq: Once | INTRAMUSCULAR | Status: AC
  Administered 2018-07-21: 30 mg via INTRAMUSCULAR
  Filled 2018-07-21: qty 2

## 2018-07-21 MED ORDER — ALBUTEROL SULFATE HFA 108 (90 BASE) MCG/ACT IN AERS
2.0000 | INHALATION_SPRAY | Freq: Once | RESPIRATORY_TRACT | Status: AC
  Administered 2018-07-21: 2 via RESPIRATORY_TRACT
  Filled 2018-07-21: qty 6.7

## 2018-07-21 NOTE — ED Provider Notes (Signed)
Emergency Department Provider Note   I have reviewed the triage vital signs and the nursing notes.   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  Patient states that she often has a sensation of breathing and she feels like she has to make her self take a breath.  She has a sharp stabbing pain in the epigastrium when she takes a big deep breath and thus takes small breaths otherwise.  Patient without any fever, cough, nausea, vomiting.  She is 1.5 packs of cigarettes a day.  No change.  She states that her nursing facility does not have known COVID at this time.   Denies myalgias arthralgias or other associated symptoms.  No lower extremity swelling.  No other associated or modifying symptoms.    History reviewed. No pertinent past medical history.  There are no active problems to display for this patient.   Past Surgical History:  Procedure Laterality Date  . CESAREAN SECTION      Current Outpatient Rx  . Order #: [id number] Class: Print  . Order #: [id number] Class: Print  . Order #: [id number] Class: Print  . Order #: [id number] Class: Print  . Order #: [id number] Class: Print  . Order #: [id number] Class: Print  . Order #: [id number] Class: Print    Allergies Benadryl [diphenhydramine hcl]  No family history on file.  Social History Social History   Tobacco Use  . Smoking status: Current Every Day Smoker    Packs/day: 1.50    Types: Cigarettes  . Smokeless tobacco: Never Used  Substance Use Topics  . Alcohol use: Yes    Comment: socially  . Drug use: No    Review of Systems  All other systems negative except as documented in the HPI. All pertinent positives and negatives as reviewed in the HPI. ____________________________________________   PHYSICAL EXAM:  VITAL SIGNS: ED Triage Vitals  Enc Vitals Group   BP 07/21/18 2214 129/75     Pulse Rate 07/21/18 2214 88     Resp 07/21/18 2214 16     Temp 07/21/18 2214 99 F (37.2 C)     Temp Source 07/21/18 2214 Oral     SpO2 07/21/18 2214 97 %     Weight 07/21/18 2242 200 lb (90.7 kg)     Height 07/21/18 2220 4\' 11"  (1.499 m)     Head Circumference --      Peak Flow --      Pain Score 07/21/18 2220 0     Pain Loc --      Pain Edu? --      Excl. in GC? --     Constitutional: Alert and oriented. Well appearing and in no acute distress. Eyes: Conjunctivae are normal. PERRL. EOMI. Head: Atraumatic. Nose: No congestion/rhinnorhea. Mouth/Throat: Mucous membranes are moist.  Oropharynx non-erythematous. Neck: No stridor.  No meningeal signs.   Cardiovascular: Normal rate, regular rhythm. Good peripheral circulation. Grossly normal heart sounds.   Respiratory: Normal respiratory effort.  No retractions. Lungs mildly diminished. Gastrointestinal: Soft and nontender. No distention.  Musculoskeletal: No lower extremity tenderness nor edema. No gross deformities of extremities. Neurologic:  Normal speech and language. No gross focal neurologic deficits are appreciated.  Skin:  Skin is warm, dry and intact. No rash noted.   ____________________________________________   EKG   EKG Interpretation  Date/Time:  Sunday July 21 2018 22:10:39 EDT Ventricular Rate:  82 PR Interval:  164 QRS Duration: 82 QT Interval:  384 QTC Calculation: 448 R Axis:   46 Text Interpretation:  Normal sinus rhythm Normal ECG No old tracing to compare Confirmed by Marily MemosMesner, Mahir Prabhakar [phone]) on 07/21/2018 10:54:27 PM     ' ____________________________________________  RADIOLOGY  Dg Chest Portable 1 View  Result Date: 07/21/2018 CLINICAL DATA:  Shortness of breath EXAM: PORTABLE CHEST 1 VIEW COMPARISON:  04/19/2017 FINDINGS: The heart size and mediastinal contours are within normal limits. Both lungs are clear. The visualized skeletal structures are unremarkable.  IMPRESSION: No active disease. Electronically Signed   By: Jasmine PangKim  Fujinaga M.D.   On: 07/21/2018 23:05    ____________________________________________   INITIAL IMPRESSION / ASSESSMENT AND PLAN / ED COURSE  [name] was evaluated in Emergency Department on 07/22/2018 for the symptoms described in the history of present illness. She was evaluated in the context of the global COVID-19 pandemic, which necessitated consideration that the patient might be at risk for infection with the SARS-CoV-2 virus that causes COVID-19. Institutional protocols and algorithms that pertain to the evaluation of patients at risk for COVID-19 are in a state of rapid change based on information released by regulatory bodies including the CDC and federal and state organizations. These policies and algorithms were followed during the patient's care in the ED.   Considered multiple etiologies for the patient's symptoms to include pulmonary embolus, coronavirus, pneumonia, bronchitis, ACS or pneumothorax however with a negative chest x-ray and a normal EKG.  She also has normal vital signs and she is PERC negative and very low risk for ACS I feel like the most likely etiology at this time is pleurisy.  Could be related to bronchitis that she is a smoker however she has not had a persistent cough that is a bit less likely.  She had improvement with albuterol, ibuprofen and steroids will continue those at home.  PCP follow-up as needed.     Pertinent labs & imaging results that were available during my care of the patient were reviewed by me and considered in my medical decision making (see chart for details).  A medical screening exam was performed and I feel the patient has had an appropriate workup for their chief complaint at this time and likelihood of emergent condition existing is low. They have been counseled on decision, discharge, follow up and which symptoms necessitate immediate return to the emergency  department. They or their family verbally stated understanding and agreement with plan and discharged in stable condition.   ____________________________________________  FINAL CLINICAL IMPRESSION(S) / ED DIAGNOSES  Final diagnoses:  Pleurisy     MEDICATIONS GIVEN DURING THIS VISIT:  Medications  albuterol (VENTOLIN HFA) 108 (90 Base) MCG/ACT inhaler 2 puff (2 puffs Inhalation Given 07/21/18 2328)  predniSONE (DELTASONE) tablet 60 mg (60 mg Oral Given 07/21/18 2325)  ketorolac (TORADOL) injection 30 mg (30 mg Intramuscular Given 07/21/18 2328)     NEW OUTPATIENT MEDICATIONS STARTED DURING THIS VISIT:  Discharge Medication List as of 07/21/2018 11:56 PM    START taking these medications   Details  predniSONE (DELTASONE) 20 MG tablet 2 tabs po daily x 4 days, Print        Note:  This note was prepared with assistance of Dragon voice recognition software. Occasional wrong-word or sound-a-like substitutions may have occurred due to the inherent limitations of voice recognition software.   Damary Doland, Barbara CowerJason, MD 07/22/18 [phone]

## 2018-07-21 NOTE — ED Triage Notes (Signed)
Pt reports SOB x2 weeks. She says she constantly has to take deep breaths to breath.  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  Denies fevers, N/V, body aches.

## 2018-07-21 NOTE — ED Notes (Addendum)
Pt ambulatory to exam room from front lobby without difficulty or need for assistance.

## 2018-07-21 NOTE — ED Notes (Signed)
ED Provider at bedside. 

## 2018-07-22 NOTE — ED Notes (Signed)
Reviewed d/c instructions with pt, who verbalized understanding and had no outstanding questions. Pt departed in NAD.   

## 2018-12-20 ENCOUNTER — Other Ambulatory Visit: Payer: Self-pay

## 2018-12-20 DIAGNOSIS — Z20822 Contact with and (suspected) exposure to covid-19: Secondary | ICD-10-CM

## 2018-12-21 LAB — NOVEL CORONAVIRUS, NAA: SARS-CoV-2, NAA: NOT DETECTED

## 2018-12-24 ENCOUNTER — Other Ambulatory Visit: Payer: Self-pay

## 2018-12-24 DIAGNOSIS — Z20822 Contact with and (suspected) exposure to covid-19: Secondary | ICD-10-CM

## 2018-12-25 LAB — NOVEL CORONAVIRUS, NAA: SARS-CoV-2, NAA: NOT DETECTED

## 2018-12-31 ENCOUNTER — Other Ambulatory Visit: Payer: Self-pay

## 2018-12-31 DIAGNOSIS — Z20822 Contact with and (suspected) exposure to covid-19: Secondary | ICD-10-CM

## 2019-01-02 LAB — NOVEL CORONAVIRUS, NAA: SARS-CoV-2, NAA: NOT DETECTED

## 2019-01-07 ENCOUNTER — Other Ambulatory Visit: Payer: Self-pay

## 2019-01-07 DIAGNOSIS — Z20822 Contact with and (suspected) exposure to covid-19: Secondary | ICD-10-CM

## 2019-01-07 IMAGING — CR DG CHEST 2V
2 series · 2 of 2 positions shown · non-contrast
Comparison: 05/11/2003

CLINICAL DATA: 37-year-old with cough and sore throat. Current
smoker.

EXAM:
CHEST  2 VIEW

[w chest pa]
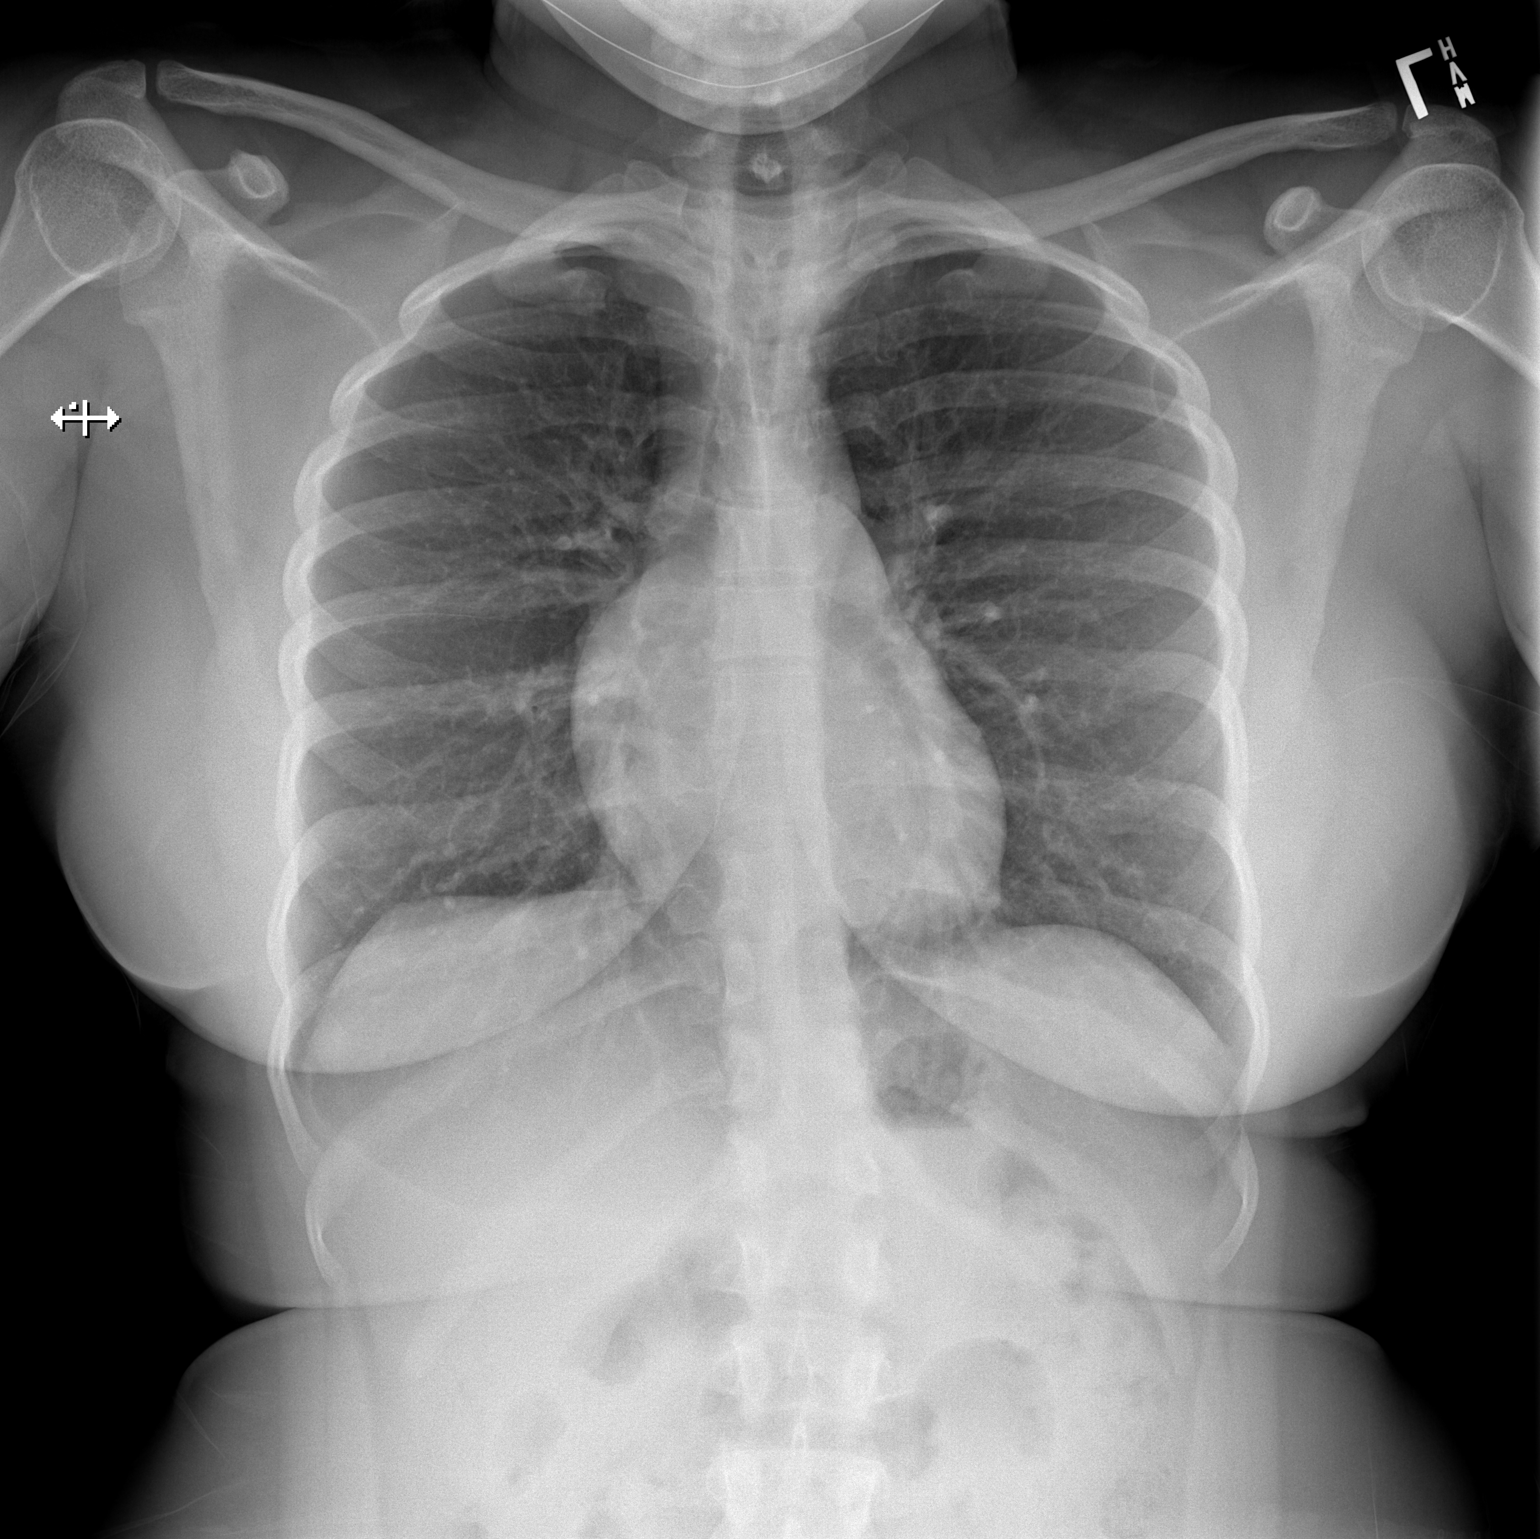

[w chest lat]
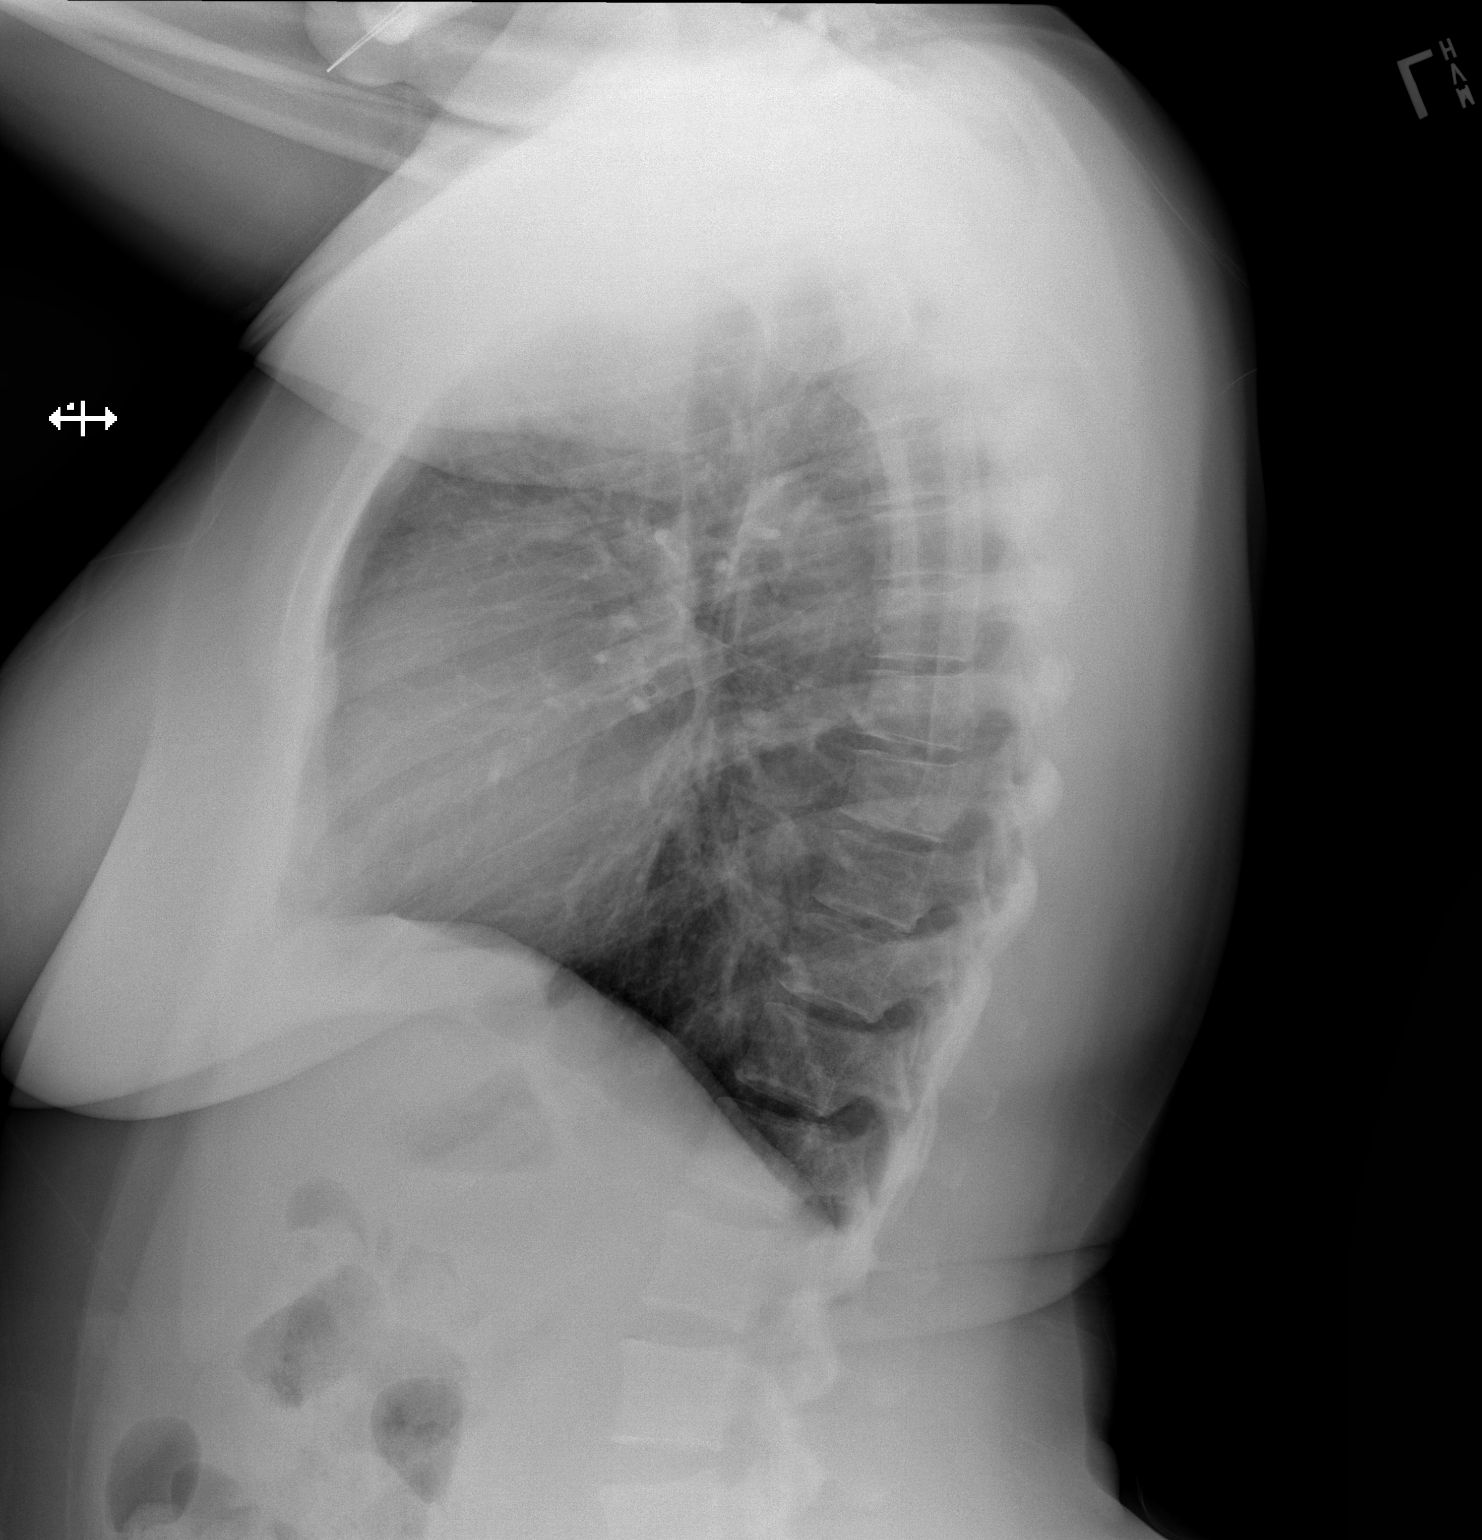

[2 of 2 positions shown; findings below may reference images not displayed]

FINDINGS: The heart size and mediastinal contours are within normal limits.
Both lungs are clear. The visualized skeletal structures are
unremarkable.
IMPRESSION: No active cardiopulmonary disease.

## 2019-01-09 LAB — NOVEL CORONAVIRUS, NAA: SARS-CoV-2, NAA: NOT DETECTED

## 2019-01-14 ENCOUNTER — Other Ambulatory Visit: Payer: Self-pay

## 2019-01-14 DIAGNOSIS — Z20822 Contact with and (suspected) exposure to covid-19: Secondary | ICD-10-CM

## 2019-01-15 LAB — NOVEL CORONAVIRUS, NAA: SARS-CoV-2, NAA: NOT DETECTED

## 2019-01-21 ENCOUNTER — Other Ambulatory Visit: Payer: Self-pay

## 2019-01-21 DIAGNOSIS — Z20822 Contact with and (suspected) exposure to covid-19: Secondary | ICD-10-CM

## 2019-01-23 LAB — NOVEL CORONAVIRUS, NAA: SARS-CoV-2, NAA: NOT DETECTED

## 2019-02-03 ENCOUNTER — Other Ambulatory Visit: Payer: Self-pay

## 2019-02-03 DIAGNOSIS — Z20822 Contact with and (suspected) exposure to covid-19: Secondary | ICD-10-CM

## 2019-02-04 LAB — NOVEL CORONAVIRUS, NAA: SARS-CoV-2, NAA: NOT DETECTED

## 2019-06-12 ENCOUNTER — Emergency Department (HOSPITAL_COMMUNITY): Payer: No Typology Code available for payment source

## 2019-06-12 ENCOUNTER — Other Ambulatory Visit: Payer: Self-pay

## 2019-06-12 ENCOUNTER — Encounter (HOSPITAL_COMMUNITY): Payer: Self-pay | Admitting: Emergency Medicine

## 2019-06-12 ENCOUNTER — Emergency Department (HOSPITAL_COMMUNITY)
Admission: EM | Admit: 2019-06-12 | Discharge: 2019-06-13 | Disposition: A | Discharge status: Home / Self Care | Payer: No Typology Code available for payment source | Attending: Emergency Medicine | Admitting: Emergency Medicine

## 2019-06-12 DIAGNOSIS — Y9389 Activity, other specified: Secondary | ICD-10-CM | POA: Insufficient documentation

## 2019-06-12 DIAGNOSIS — Z79899 Other long term (current) drug therapy: Secondary | ICD-10-CM | POA: Insufficient documentation

## 2019-06-12 DIAGNOSIS — Y92410 Unspecified street and highway as the place of occurrence of the external cause: Secondary | ICD-10-CM | POA: Insufficient documentation

## 2019-06-12 DIAGNOSIS — F17210 Nicotine dependence, cigarettes, uncomplicated: Secondary | ICD-10-CM | POA: Insufficient documentation

## 2019-06-12 DIAGNOSIS — S0990XA Unspecified injury of head, initial encounter: Secondary | ICD-10-CM | POA: Diagnosis not present

## 2019-06-12 DIAGNOSIS — M542 Cervicalgia: Secondary | ICD-10-CM | POA: Diagnosis not present

## 2019-06-12 DIAGNOSIS — Y998 Other external cause status: Secondary | ICD-10-CM | POA: Diagnosis not present

## 2019-06-12 LAB — BASIC METABOLIC PANEL
Anion gap: 10 (ref 5–15)
BUN: 10 mg/dL (ref 6–20)
CO2: 21 mmol/L — ABNORMAL LOW (ref 22–32)
Calcium: 9.1 mg/dL (ref 8.9–10.3)
Chloride: 106 mmol/L (ref 98–111)
Creatinine, Ser: 0.73 mg/dL (ref 0.44–1.00)
GFR calc Af Amer: >60 mL/min (ref >60)
GFR calc non Af Amer: >60 mL/min (ref >60)
Glucose, Bld: 111 mg/dL — ABNORMAL HIGH (ref 70–99)
Potassium: 3.8 mmol/L (ref 3.5–5.1)
Sodium: 137 mmol/L (ref 135–145)

## 2019-06-12 LAB — CBC WITH DIFFERENTIAL/PLATELET
Abs Immature Granulocytes: 0.02 10*3/uL (ref 0.00–0.07)
Basophils Absolute: 0 10*3/uL (ref 0.0–0.1)
Basophils Relative: 1 %
Eosinophils Absolute: 0.1 10*3/uL (ref 0.0–0.5)
Eosinophils Relative: 1 %
HCT: 36.8 % (ref 36.0–46.0)
Hemoglobin: 11.8 g/dL — ABNORMAL LOW (ref 12.0–15.0)
Immature Granulocytes: 0 %
Lymphocytes Relative: 30 %
Lymphs Abs: 1.8 10*3/uL (ref 0.7–4.0)
MCH: 32 pg (ref 26.0–34.0)
MCHC: 32.1 g/dL (ref 30.0–36.0)
MCV: 99.7 fL (ref 80.0–100.0)
Monocytes Absolute: 0.5 10*3/uL (ref 0.1–1.0)
Monocytes Relative: 8 %
Neutro Abs: 3.8 10*3/uL (ref 1.7–7.7)
Neutrophils Relative %: 60 %
Platelets: 322 10*3/uL (ref 150–400)
RBC: 3.69 MIL/uL — ABNORMAL LOW (ref 3.87–5.11)
RDW: 12.3 % (ref 11.5–15.5)
WBC: 6.2 10*3/uL (ref 4.0–10.5)
nRBC: 0 % (ref 0.0–0.2)

## 2019-06-12 LAB — I-STAT BETA HCG BLOOD, ED (MC, WL, AP ONLY): I-stat hCG, quantitative: <5 m[IU]/mL (ref <5)
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
## 2019-06-13 MED ORDER — DIAZEPAM 2 MG PO TABS
2.0000 mg | ORAL_TABLET | Freq: Once | ORAL | Status: AC
  Administered 2019-06-13: 2 mg via ORAL
  Filled 2019-06-13: qty 1

## 2019-06-13 MED ORDER — KETOROLAC TROMETHAMINE 60 MG/2ML IM SOLN
30.0000 mg | Freq: Once | INTRAMUSCULAR | Status: AC
  Administered 2019-06-13: 30 mg via INTRAMUSCULAR
  Filled 2019-06-13: qty 2

## 2019-06-13 MED ORDER — CYCLOBENZAPRINE HCL 10 MG PO TABS: 10.0000 mg | ORAL_TABLET | Freq: Two times a day (BID) | ORAL | 0 refills | Status: DC | PRN

## 2019-06-13 NOTE — ED Notes (Signed)
Family at bedside. 
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
## 2019-10-23 ENCOUNTER — Ambulatory Visit (HOSPITAL_COMMUNITY)
Admission: EM | Admit: 2019-10-23 | Discharge: 2019-10-23 | Disposition: A | Discharge status: Home / Self Care | Payer: Self-pay | Attending: Family Medicine | Admitting: Family Medicine

## 2019-10-23 ENCOUNTER — Ambulatory Visit (INDEPENDENT_AMBULATORY_CARE_PROVIDER_SITE_OTHER): Payer: Self-pay

## 2019-10-23 ENCOUNTER — Encounter (HOSPITAL_COMMUNITY): Payer: Self-pay

## 2019-10-23 ENCOUNTER — Other Ambulatory Visit: Payer: Self-pay

## 2019-10-23 DIAGNOSIS — M25562 Pain in left knee: Secondary | ICD-10-CM

## 2019-10-23 MED ORDER — PREDNISONE 20 MG PO TABS: 20.0000 mg | ORAL_TABLET | Freq: Two times a day (BID) | ORAL | 0 refills | Status: DC

## 2019-10-23 MED ORDER — MELOXICAM 15 MG PO TABS: 15.0000 mg | ORAL_TABLET | Freq: Every day | ORAL | 0 refills | Status: DC

## 2019-10-23 NOTE — ED Triage Notes (Signed)
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

## 2019-10-23 NOTE — Discharge Instructions (Signed)
Take the prednisone 2 times a day for 5 days This will take down inflammation and should help with the pain and swelling  after the ibuprofen, start meloxicam once a day Do not take Advil or Aleve with meloxicam You may take Tylenol as needed Follow-up with your primary care doctor if you fail to improve

## 2019-10-23 NOTE — ED Provider Notes (Signed)
[facility]    CSN: [id number] Arrival date & time: 10/23/19  1752      History   Chief Complaint Chief Complaint  Patient presents with  . Leg Pain  . Foot Pain    HPI [name] is a 39 y.o. female.   HPI  Patient's had left knee pain for a long time.  She used to work in a job where she sat at [employer].  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  She has been there a month.  She is having increasing difficulty with pain in her left knee.  It swells.  It hurts.  It grinds.  She thinks she is having enough pain that she may have to find another job.  She has not had any fall or injury.  She takes over-the-counter medicine as needed.  History reviewed. No pertinent past medical history.  There are no problems to display for this patient.   Past Surgical History:  Procedure Laterality Date  . CESAREAN SECTION      OB History   No obstetric history on file.      Home Medications    Prior to Admission medications   Medication Sig Start Date End Date Taking? Authorizing Provider  meloxicam (MOBIC) 15 MG tablet Take 1 tablet (15 mg total) by mouth daily. 10/23/19   Eustace Moore, MD  predniSONE (DELTASONE) 20 MG tablet Take 1 tablet (20 mg total) by mouth 2 (two) times daily with a meal. 10/23/19   Eustace Moore, MD    Family History Family History  Family history unknown: Yes    Social History Social History   Tobacco Use  . Smoking status: Current Every Day Smoker    Packs/day: 1.50    Types: Cigarettes  . Smokeless tobacco: Never Used  Substance Use Topics  . Alcohol use: Yes    Comment: socially  . Drug use: No     Allergies   Benadryl [diphenhydramine hcl]   Review of Systems Review of Systems See HPI Physical Exam Triage Vital Signs ED Triage Vitals  Enc Vitals Group     BP 10/23/19 1822 (!) 118/59     Pulse Rate 10/23/19 1822 78     Resp 10/23/19 1822 17     Temp 10/23/19 1822 (!) 97.3 F (36.3 C)      Temp Source 10/23/19 1822 Oral     SpO2 10/23/19 1822 100 %     Weight --      Height --      Head Circumference --      Peak Flow --      Pain Score 10/23/19 1823 7     Pain Loc --      Pain Edu? --      Excl. in GC? --    No data found.  Updated Vital Signs BP (!) 118/59 (BP Location: Right Arm)   Pulse 78   Temp (!) 97.3 F (36.3 C) (Oral)   Resp 17   LMP 09/28/2019   SpO2 100%      Physical Exam Constitutional:      General: She is not in acute distress.    Appearance: She is well-developed.  HENT:     Head: Normocephalic and atraumatic.  Eyes:     Conjunctiva/sclera: Conjunctivae normal.     Pupils: Pupils are equal, round, and reactive to light.  Cardiovascular:     Rate and Rhythm: Normal  rate.  Pulmonary:     Effort: Pulmonary effort is normal. No respiratory distress.  Abdominal:     General: There is no distension.     Palpations: Abdomen is soft.  Musculoskeletal:        General: Normal range of motion.     Cervical back: Normal range of motion.     Comments: Left knee has full range of motion.  Trace effusion.  Medial joint line tenderness.  No instability  Skin:    General: Skin is warm and dry.  Neurological:     Mental Status: She is alert.  Psychiatric:        Mood and Affect: Mood normal.        Behavior: Behavior normal.      UC Treatments / Results  Labs (all labs ordered are listed, but only abnormal results are displayed) Labs Reviewed - No data to display  EKG   Radiology DG Knee AP/LAT W/Sunrise Left  Result Date: 10/23/2019 CLINICAL DATA:  39 year old female with left knee pain. EXAM: LEFT KNEE 3 VIEWS COMPARISON:  Left knee radiograph dated 06/03/2018. FINDINGS: No evidence of fracture, dislocation, or joint effusion. No evidence of arthropathy or other focal bone abnormality. Soft tissues are unremarkable. IMPRESSION: Negative. Electronically Signed   By: Elgie Collard M.D.   On: 10/23/2019 19:46     Procedures Procedures (including critical care time)  Medications Ordered in UC Medications - No data to display  Initial Impression / Assessment and Plan / UC Course  I have reviewed the triage vital signs and the nursing notes.  Pertinent labs & imaging results that were available during my care of the patient were reviewed by me and considered in my medical decision making (see chart for details).     Discussed x-ray.  Her findings are more inflammatory than true cartilage loss.  This is good news.  Follow-up with sports medicine Final Clinical Impressions(s) / UC Diagnoses   Final diagnoses:  Medial knee pain, left     Discharge Instructions     Take the prednisone 2 times a day for 5 days This will take down inflammation and should help with the pain and swelling  after the ibuprofen, start meloxicam once a day Do not take Advil or Aleve with meloxicam You may take Tylenol as needed Follow-up with your primary care doctor if you fail to improve    ED Prescriptions    Medication Sig Dispense Auth. Provider   predniSONE (DELTASONE) 20 MG tablet Take 1 tablet (20 mg total) by mouth 2 (two) times daily with a meal. 10 tablet Eustace Moore, MD   meloxicam (MOBIC) 15 MG tablet Take 1 tablet (15 mg total) by mouth daily. 30 tablet Eustace Moore, MD     PDMP not reviewed this encounter.   Eustace Moore, MD 10/23/19 2022

## 2020-02-02 ENCOUNTER — Ambulatory Visit (HOSPITAL_COMMUNITY)
Admission: EM | Admit: 2020-02-02 | Discharge: 2020-02-02 | Disposition: A | Discharge status: Home / Self Care | Payer: Medicaid Other | Attending: Family Medicine | Admitting: Family Medicine

## 2020-02-02 ENCOUNTER — Other Ambulatory Visit: Payer: Self-pay

## 2020-02-02 ENCOUNTER — Encounter (HOSPITAL_COMMUNITY): Payer: Self-pay

## 2020-02-02 DIAGNOSIS — M25561 Pain in right knee: Secondary | ICD-10-CM

## 2020-02-02 NOTE — ED Provider Notes (Signed)
[facility]    CSN: [id number] Arrival date & time: 02/02/20  0907      History   Chief Complaint Chief Complaint  Patient presents with  . Knee Pain    HPI [name] is a 39 y.o. female.   Pt is a 40 year old female that presents today with knee pain. This is located to the right knee. Hx of chronic knee pain. Was seen here months back for left knee pain. On her feet a lot at work. There has been swelling of the knee, popping, cracking. She has been taking tylenol with minimal relief. Pain worse when getting up in the morning. Feels stiff. The pain is generalized. No injury. Feels like her knee is going to give out on her sometimes.      History reviewed. No pertinent past medical history.  There are no problems to display for this patient.   Past Surgical History:  Procedure Laterality Date  . CESAREAN SECTION      OB History   No obstetric history on file.      Home Medications    Prior to Admission medications   Not on File    Family History Family History  Problem Relation Age of Onset  . Diabetes Mother   . Diabetes Father   . Heart Problems Father   . Hypertension Father     Social History Social History   Tobacco Use  . Smoking status: Current Every Day Smoker    Packs/day: 1.50    Types: Cigarettes  . Smokeless tobacco: Never Used  Substance Use Topics  . Alcohol use: Yes    Comment: socially  . Drug use: No     Allergies   Benadryl [diphenhydramine hcl]   Review of Systems Review of Systems   Physical Exam Triage Vital Signs ED Triage Vitals  Enc Vitals Group     BP 02/02/20 0944 (!) 94/51     Pulse Rate 02/02/20 0944 80     Resp 02/02/20 0944 18     Temp 02/02/20 0944 98.5 F (36.9 C)     Temp Source 02/02/20 0944 Oral     SpO2 02/02/20 0944 100 %     Weight --      Height --      Head Circumference --      Peak Flow --      Pain Score 02/02/20 0941 5     Pain Loc --      Pain Edu? --       Excl. in GC? --    No data found.  Updated Vital Signs BP (!) 94/51 (BP Location: Right Arm)   Pulse 80   Temp 98.5 F (36.9 C) (Oral)   Resp 18   LMP 01/04/2020   SpO2 100%   Visual Acuity Right Eye Distance:   Left Eye Distance:   Bilateral Distance:    Right Eye Near:   Left Eye Near:    Bilateral Near:     Physical Exam Vitals and nursing note reviewed.  Constitutional:      General: She is not in acute distress.    Appearance: Normal appearance. She is not ill-appearing, toxic-appearing or diaphoretic.  HENT:     Head: Normocephalic.     Nose: Nose normal.  Eyes:     Conjunctiva/sclera: Conjunctivae normal.  Pulmonary:     Effort: Pulmonary effort is normal.  Musculoskeletal:     Cervical back: Normal range of motion.  Right knee: Swelling and bony tenderness present. Decreased range of motion. Tenderness present over the medial joint line and lateral joint line.  Skin:    General: Skin is warm and dry.     Findings: No rash.  Neurological:     Mental Status: She is alert.  Psychiatric:        Mood and Affect: Mood normal.      UC Treatments / Results  Labs (all labs ordered are listed, but only abnormal results are displayed) Labs Reviewed - No data to display  EKG   Radiology No results found.  Procedures Procedures (including critical care time)  Medications Ordered in UC Medications - No data to display  Initial Impression / Assessment and Plan / UC Course  I have reviewed the triage vital signs and the nursing notes.  Pertinent labs & imaging results that were available during my care of the patient were reviewed by me and considered in my medical decision making (see chart for details).     Right knee pain Most likely arthritis or meniscus injury Recommend follow up with sports medicine. Referral placed.  Ibuprofen 600 mg every 8 hours for pain.  Knee brace provided here.  Rest, Ice, Elevate Follow up as needed for continued  or worsening symptoms  Final Clinical Impressions(s) / UC Diagnoses   Final diagnoses:  Acute pain of right knee     Discharge Instructions     I have referred you to sports medicine Ibuprofen 600 mg every 8 hours for pain and tylenol in addition as needed.  Knee brace applied here. Rest, Ice the knee and elevate the knee.      ED Prescriptions    None     PDMP not reviewed this encounter.   Janace Aris, NP 02/02/20 1206

## 2020-02-02 NOTE — Discharge Instructions (Signed)
I have referred you to sports medicine Ibuprofen 600 mg every 8 hours for pain and tylenol in addition as needed.  Knee brace applied here. Rest, Ice the knee and elevate the knee.

## 2020-02-02 NOTE — ED Triage Notes (Signed)
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

## 2020-02-06 ENCOUNTER — Other Ambulatory Visit: Payer: Self-pay

## 2020-02-06 ENCOUNTER — Ambulatory Visit (INDEPENDENT_AMBULATORY_CARE_PROVIDER_SITE_OTHER): Payer: Self-pay | Admitting: Family Medicine

## 2020-02-06 VITALS — BP 116/72 | Ht 59.0 in | Wt 184.0 lb

## 2020-02-06 DIAGNOSIS — G8929 Other chronic pain: Secondary | ICD-10-CM

## 2020-02-06 DIAGNOSIS — M25561 Pain in right knee: Secondary | ICD-10-CM

## 2020-02-06 DIAGNOSIS — M25562 Pain in left knee: Secondary | ICD-10-CM

## 2020-02-06 NOTE — Progress Notes (Signed)
Office Visit Note   Patient: [name]           Date of Birth: 01/04/1981           MRN: [id number] Visit Date: 02/06/2020 Requested by: Janace Aris, NP [address] [place],  [place] [address] PCP: Patient, No Pcp Per  Subjective: CC: Bilateral Knee Pain  HPI: 39 year old female presenting to clinic with concerns of 2 months of  bilateral knee pain.  Patient denies any trauma preceding her symptoms, but does state that she works on her feet all day-and feels that this is worsening her symptoms.  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  She also states that her pain is worse in prolonged car rides, and says that her knees will "pop" when she gets up from standing.  She also states that her knee pain will worsen with prolonged sitting, and she feels that she needs to stand to relieve pressure within them.  Denies any overt effusions, or bruises.  She states that her knees will feel "stiff" in the morning, as well as a "tight" sensation.  She has no other concerns today.  She states that she is otherwise healthy.              ROS:   All other systems were reviewed and are negative.  Objective: Vital Signs: BP 116/72   Ht 4\' 11"  (1.499 m)   Wt 184 lb (83.5 kg)   BMI 37.16 kg/m   Physical Exam:  General:  Alert and oriented, in no acute distress. Pulm:  Breathing unlabored. Psy:  Normal mood, congruent affect. Skin: Bilateral knees with no bruising, rashes, or erythema.  Overlying skin intact. BILATERAL KNEE EXAM:  General: Normal gait Standing exam: No varus or valgus deformity of the knee.  Mild pes planus bilaterally. Trendelenburg: No evidence of hip stabilizer weakness  Seated Exam:  No patellar crepitus, Negative J-Sign.  Does have decreased VMO bulk bilaterally.  Palpation: Endorses tenderness to palpation over medial and lateral joint lines.  Endorses tenderness with palpation of  patella as well as patellar tendon.  Endorses tenderness over patellar facets, most notable over lateral aspect.   Supine exam: No effusion, normal patellar mobility.   Ligamentous Exam:  No pain or laxity with anterior/posterior drawer.  No obvious Sag.  No pain or laxity with varus/valgus stress across the knee.   Meniscus:  McMurray with no pain or deep clicking.  Thessaly negative.   5 out of 5 strength with knee flexion and extension, ankle dorsi and plantar flexion, as well as hip flexion.  Sensation intact throughout bilateral lower extremities.   Imaging: No results found.  Assessment & Plan: 39 year old female presenting to clinic with concerns of bilateral atraumatic knee pain for the past 2 months.  Examination as above, which is reassuring against ligamentous or meniscal injury.  Patient recently had x-rays which did not display any significant evidence of degenerative changes.  On exam, she does have tenderness throughout her knee-though this seems most notable around the patellar facets.  Positive theater sign, as well as patellar "popping" suggestive of patellofemoral pain as a source of her symptoms. -We will give quad exercises to help rebalance her patella. -Discussed safe NSAID and Tylenol use. -Encouraged to try over-the-counter insoles to see if this improves her symptoms. -Return to clinic in 6 weeks for evaluation. -Patient expresses understanding, and  has no further questions or concerns today.   I was the preceptor for this visit and available for immediate consultation Marsa Aris, DO

## 2020-02-06 NOTE — Patient Instructions (Signed)
You have patellofemoral syndrome. Avoid painful activities when possible (often deep squats, lunges bother this). Straight leg raise, hip side raises, straight leg raises with foot turned outwards 3 sets of 10 once a day. Add ankle weight if these become too easy. Consider formal physical therapy. Correct foot breakdown with something like dr. Jari [employer]man active series, spencos, or our green sports insoles. Avoid flat shoes, barefoot walking as much as possible. Icing 15 minutes at a time 3-4 times a day as needed. Tylenol or ibuprofen as needed for pain. Follow up with me in 6 weeks.

## 2020-03-17 ENCOUNTER — Ambulatory Visit: Payer: Medicaid Other | Admitting: Family Medicine

## 2020-05-20 ENCOUNTER — Other Ambulatory Visit: Payer: Self-pay

## 2020-05-20 ENCOUNTER — Encounter (HOSPITAL_BASED_OUTPATIENT_CLINIC_OR_DEPARTMENT_OTHER): Payer: Self-pay

## 2020-05-20 ENCOUNTER — Emergency Department (HOSPITAL_BASED_OUTPATIENT_CLINIC_OR_DEPARTMENT_OTHER): Payer: No Typology Code available for payment source | Attending: Emergency Medicine

## 2020-05-20 ENCOUNTER — Emergency Department (HOSPITAL_BASED_OUTPATIENT_CLINIC_OR_DEPARTMENT_OTHER)
Admission: EM | Admit: 2020-05-20 | Discharge: 2020-05-20 | Disposition: A | Discharge status: Home / Self Care | Payer: Worker's Compensation | Attending: Emergency Medicine | Admitting: Emergency Medicine

## 2020-05-20 DIAGNOSIS — F17210 Nicotine dependence, cigarettes, uncomplicated: Secondary | ICD-10-CM | POA: Diagnosis not present

## 2020-05-20 DIAGNOSIS — W208XXA Other cause of strike by thrown, projected or falling object, initial encounter: Secondary | ICD-10-CM | POA: Diagnosis not present

## 2020-05-20 DIAGNOSIS — Y990 Civilian activity done for income or pay: Secondary | ICD-10-CM | POA: Insufficient documentation

## 2020-05-20 DIAGNOSIS — S99921A Unspecified injury of right foot, initial encounter: Secondary | ICD-10-CM | POA: Diagnosis not present

## 2020-05-20 DIAGNOSIS — T1490XA Injury, unspecified, initial encounter: Secondary | ICD-10-CM

## 2020-05-20 MED ORDER — KETOROLAC TROMETHAMINE 60 MG/2ML IM SOLN
60.0000 mg | Freq: Once | INTRAMUSCULAR | Status: AC
  Administered 2020-05-20: 60 mg via INTRAMUSCULAR
  Filled 2020-05-20: qty 2

## 2020-05-20 NOTE — Discharge Instructions (Signed)
You were evaluated in the Emergency Department and after careful evaluation, we did not find any emergent condition requiring admission or further testing in the hospital.  Apply a compressive ACE bandage. Rest and elevate the affected painful area.  Apply cold compresses intermittently as needed.  You may take Tylenol/ibuprofen for pain.  Use the cam walker for comfort.  As pain recedes, begin normal activities slowly as tolerated.  Call if symptoms persist.   Please return to the Emergency Department if you experience any worsening of your condition.  We encourage you to follow up with a primary care provider.  Thank you for allowing [place] to be a part of your care.

## 2020-05-20 NOTE — ED Triage Notes (Addendum)
Pt states a pallet fell on her right foot at work ~3pm-no break in skin-NAD-limping gait

## 2020-05-20 NOTE — ED Notes (Signed)
Patient transported to X-ray 

## 2020-05-20 NOTE — ED Provider Notes (Signed)
[facility] EMERGENCY DEPARTMENT Provider Note   CSN: [id number] Arrival date & time: 05/20/20  1717     History Chief Complaint  Patient presents with  . Foot Injury    ARDELL Thomas is a 40 y.o. female.  HPI 40 year old female with no significant medical history presents to the ER with right foot pain after a pallet fell onto her foot.  She has been able to bear weight, but does have pain.  Denies any numbness or tingling.  Came straight from work, has not taken anything for pain.    History reviewed. No pertinent past medical history.  There are no problems to display for this patient.   Past Surgical History:  Procedure Laterality Date  . CESAREAN SECTION       OB History   No obstetric history on file.     Family History  Problem Relation Age of Onset  . Diabetes Mother   . Diabetes Father   . Heart Problems Father   . Hypertension Father     Social History   Tobacco Use  . Smoking status: Current Every Day Smoker    Packs/day: 1.50    Types: Cigarettes  . Smokeless tobacco: Never Used  Substance Use Topics  . Alcohol use: Yes    Comment: socially  . Drug use: No    Home Medications Prior to Admission medications   Not on File    Allergies    Benadryl [diphenhydramine hcl]  Review of Systems   Review of Systems  Musculoskeletal: Positive for arthralgias.  Neurological: Negative for weakness and numbness.    Physical Exam Updated Vital Signs BP (!) 116/91 (BP Location: Left Arm)   Pulse 75   Temp 98.3 F (36.8 C) (Oral)   Resp 14   Ht 4\' 11"  (1.499 m)   Wt 84.4 kg   LMP 05/20/2020   SpO2 99%   BMI 37.57 kg/m   Physical Exam Vitals reviewed.  Constitutional:      General: She is not in acute distress.    Appearance: Normal appearance. She is not ill-appearing, toxic-appearing or diaphoretic.  HENT:     Head: Normocephalic and atraumatic.  Eyes:     General:        Right eye: No discharge.        Left eye: No  discharge.     Extraocular Movements: Extraocular movements intact.     Conjunctiva/sclera: Conjunctivae normal.  Musculoskeletal:        General: Swelling and tenderness present. No deformity. Normal range of motion.     Comments: Right dorsal aspect of the midfoot with evidence of mild erythema, no significant wounds or deformities noted.  Mild tenderness to palpation to the midfoot 2+ DP pulses.  Able to move all 5 digits without difficulty,<2 cap refill.  Mild edema to the midfoot noted.  Full flexion extension of the right ankle.  Patient ambulated in the ER, though with a mild limp.  Skin:    Findings: Erythema present. No bruising.  Neurological:     General: No focal deficit present.     Mental Status: She is alert and oriented to person, place, and time.     Sensory: No sensory deficit.     Motor: No weakness.  Psychiatric:        Mood and Affect: Mood normal.        Behavior: Behavior normal.     ED Results / Procedures / Treatments   Labs (all  labs ordered are listed, but only abnormal results are displayed) Labs Reviewed - No data to display  EKG None  Radiology DG Foot Complete Right  Result Date: 05/20/2020 CLINICAL DATA:  Heavy pallet fell on right foot today. Foot pain. Initial encounter. EXAM: RIGHT FOOT COMPLETE - 3+ VIEW COMPARISON:  None. FINDINGS: There is no evidence of fracture or dislocation. There is no evidence of arthropathy or other focal bone abnormality. Soft tissues are unremarkable. IMPRESSION: Negative. Electronically Signed   By: Danae Orleans M.D.   On: 05/20/2020 18:42    Procedures Procedures   Medications Ordered in ED Medications  ketorolac (TORADOL) injection 60 mg (has no administration in time range)    ED Course  I have reviewed the triage vital signs and the nursing notes.  Pertinent labs & imaging results that were available during my care of the patient were reviewed by me and considered in my medical decision making (see chart  for details).    MDM Rules/Calculators/A&P                          41 year old female presents to the ER with right midfoot pain after a pallet fell onto it.  Plain films without evidence of fracture, personally reviewed by me.  Patient is neurovascularly intact.  No evidence of deformities.  Patient provided Toradol here in the ER, offered cam walker for comfort which the patient is agreeable to.  Encouraged PCP follow-up.  Encouraged Tylenol/ibuprofen for pain, RICE protocol.  Return precautions discussed.  She voiced understanding is agreeable.  Stable for discharge at this time. Final Clinical Impression(s) / ED Diagnoses Final diagnoses:  Injury  Injury of right foot, initial encounter    Rx / DC Orders ED Discharge Orders    None       Leone Brand 05/20/20 1917    Clarene Duke Ambrose [place], MD 05/21/20 1504

## 2020-06-01 ENCOUNTER — Inpatient Hospital Stay (INDEPENDENT_AMBULATORY_CARE_PROVIDER_SITE_OTHER): Payer: Medicaid Other | Admitting: Primary Care

## 2021-05-25 ENCOUNTER — Telehealth: Payer: No Typology Code available for payment source | Admitting: Emergency Medicine

## 2021-05-25 DIAGNOSIS — U071 COVID-19: Secondary | ICD-10-CM

## 2021-05-25 MED ORDER — FLUTICASONE PROPIONATE 50 MCG/ACT NA SUSP: 2.0000 | Freq: Every day | NASAL | 0 refills | Status: DC

## 2021-05-25 MED ORDER — ALBUTEROL SULFATE HFA 108 (90 BASE) MCG/ACT IN AERS: 2.0000 | INHALATION_SPRAY | Freq: Four times a day (QID) | RESPIRATORY_TRACT | 0 refills | Status: DC | PRN

## 2021-05-25 MED ORDER — SPACER/AERO-HOLDING CHAMBERS DEVI: 1.0000 | 0 refills | Status: DC | PRN

## 2021-05-25 NOTE — Progress Notes (Signed)
E-Visit  for Positive Covid Test Result ? ?We are sorry you are not feeling well. We are here to help! ? ?You have tested positive for COVID-19, meaning that you were infected with the novel coronavirus and could give the virus to others.  It is vitally important that you stay home so you do not spread it to others.     ? ?Please continue isolation at home, for at least 10 days since the start of your symptoms and until you have had 24 hours with no fever (without taking a fever reducer) and with improving of symptoms.  If you have no symptoms but tested positive (or all symptoms resolve after 5 days and you have no fever) you can leave your house but continue to wear a mask around others for an additional 5 days. If you have a fever,continue to stay home until you have had 24 hours of no fever. Most cases improve 5-10 days from onset but we have seen a small number of patients who have gotten worse after the 10 days.  Please be sure to watch for worsening symptoms and remain taking the proper precautions.  ? ?I have sent a work note for you through [facility] ? ?Go to the nearest hospital ED for assessment if fever/cough/breathlessness are severe or illness seems like a threat to life.   ? ?The following symptoms may appear 2-14 days after exposure: ?Fever ?Cough ?Shortness of breath or difficulty breathing ?Chills ?Repeated shaking with chills ?Muscle pain ?Headache ?Sore throat ?New loss of taste or smell ?Fatigue ?Congestion or runny nose ?Nausea or vomiting ?Diarrhea ? ? ?You can use medication such as  prescription inhaler called Albuterol MDI 90 mcg /actuation 2 puffs every 4 hours as needed for shortness of breath, wheezing, cough and prescription for Fluticasone nasal spray 2 sprays in each nostril one time per day ? ?You may also take acetaminophen (Tylenol) as needed for fever. ? ?HOME CARE: ?Only take medications as instructed by your medical team. ?Drink plenty of fluids and get plenty of rest. ?A steam or  ultrasonic humidifier can help if you have congestion.  ? ?GET HELP RIGHT AWAY IF YOU HAVE EMERGENCY WARNING SIGNS.  ?Call 911 or proceed to your closest emergency facility if: ?You develop worsening high fever. ?Trouble breathing ?Bluish lips or face ?Persistent pain or pressure in the chest ?New confusion ?Inability to wake or stay awake ?You cough up blood. ?Your symptoms become more severe ?Inability to hold down food or fluids ? ?This list is not all possible symptoms. Contact your medical provider for any symptoms that are severe or concerning to you. ? ? ? ?Your e-visit answers were reviewed by a board certified advanced clinical practitioner to complete your personal care plan.  Depending on the condition, your plan could have included both over the counter or prescription medications.  If there is a problem please reply once you have received a response from your provider. ? ?Your safety is important to [place].  If you have drug allergies check your prescription carefully.   ? ?You can use MyChart to ask questions about today's visit, request a non-urgent call back, or ask for a work or school excuse for 24 hours related to this e-Visit. If it has been greater than 24 hours you will need to follow up with your provider, or enter a new e-Visit to address those concerns. ?You will get an e-mail in the next two days asking about your experience.  I hope that  your e-visit has been valuable and will speed your recovery. Thank you for using e-visits. ? ?I have spent 5 minutes in review of e-visit questionnaire, review and updating patient chart, medical decision making and response to patient.  ? ?Willeen Cass, PhD, FNP-BC ? ? ? ? ?

## 2021-05-26 ENCOUNTER — Ambulatory Visit
Admission: RE | Admit: 2021-05-26 | Discharge: 2021-05-26 | Disposition: A | Discharge status: Home / Self Care | Payer: No Typology Code available for payment source | Source: Ambulatory Visit | Attending: Internal Medicine | Admitting: Internal Medicine

## 2021-05-26 VITALS — BP 114/64 | HR 74 | Temp 98.9°F | Resp 20 | Ht 59.0 in | Wt 170.0 lb

## 2021-05-26 DIAGNOSIS — R0602 Shortness of breath: Secondary | ICD-10-CM

## 2021-05-26 DIAGNOSIS — U071 COVID-19: Secondary | ICD-10-CM | POA: Diagnosis not present

## 2021-05-26 MED ORDER — PREDNISONE 20 MG PO TABS: 40.0000 mg | ORAL_TABLET | Freq: Every day | ORAL | 0 refills | Status: AC

## 2021-05-26 NOTE — ED Triage Notes (Signed)
Patient states that she was diagnosed with COVID on 05/16/21, fever, been talking with [facility], wanted patient to get checked out.  Patient having difficulty with SOB and some dizziness.  Patient does get winded with short distances. ?

## 2021-05-26 NOTE — ED Provider Notes (Signed)
?[facility] ? ? ? ?CSN: [id number] ?Arrival date & time: 05/26/21  1234 ? ? ?  ? ?History   ?Chief Complaint ?Chief Complaint  ?Patient presents with  ? COVID on 05/16/2021  ? ? ?HPI ?[name] is a 41 y.o. female.  ? ?Patient presents for further evaluation after testing positive for COVID-19 on 05/16/2021.  She reports that all of her original symptoms have resolved but she has been having intermittent shortness of breath over the past few days.  She reports that shortness of breath mainly occurs with exertion but does occur at times when she is sitting still.  Denies any history of asthma or COPD.  Patient reports that she is a smoker.  There is a video visit present in chart where the provider sent Flonase and albuterol inhaler but patient claims that she did not have this video visit and is not aware of medications that were sent over.  Therefore, she has not taken any of these medications.  Denies any fevers. ? ? ? ?History reviewed. No pertinent past medical history. ? ?There are no problems to display for this patient. ? ? ?Past Surgical History:  ?Procedure Laterality Date  ? CESAREAN SECTION    ? ? ?OB History   ?No obstetric history on file. ?  ? ? ? ?Home Medications   ? ?Prior to Admission medications   ?Medication Sig Start Date End Date Taking? Authorizing Provider  ?predniSONE (DELTASONE) 20 MG tablet Take 2 tablets (40 mg total) by mouth daily for 5 days. 05/26/21 05/31/21 Yes Kyliegh Jester, Acie Fredrickson, FNP  ?albuterol (VENTOLIN HFA) 108 (90 Base) MCG/ACT inhaler Inhale 2 puffs into the lungs every 6 (six) hours as needed for wheezing or shortness of breath. 05/25/21   Cathlyn Parsons, NP  ?fluticasone (FLONASE) 50 MCG/ACT nasal spray Place 2 sprays into both nostrils daily. 05/25/21   Cathlyn Parsons, NP  ?Spacer/Aero-Holding Chambers DEVI 1 each by Does not apply route as needed. 05/25/21   Cathlyn Parsons, NP  ? ? ?Family History ?Family History  ?Problem Relation Age of Onset  ? Diabetes Mother   ?  Diabetes Father   ? Heart Problems Father   ? Hypertension Father   ? ? ?Social History ?Social History  ? ?Tobacco Use  ? Smoking status: Every Day  ?  Packs/day: 1.50  ?  Types: Cigarettes  ? Smokeless tobacco: Never  ?Substance Use Topics  ? Alcohol use: Yes  ?  Comment: socially  ? Drug use: No  ? ? ? ?Allergies   ?Benadryl [diphenhydramine hcl] ? ? ?Review of Systems ?Review of Systems ?Per HPI ? ?Physical Exam ?Triage Vital Signs ?ED Triage Vitals  ?Enc Vitals Group  ?   BP 05/26/21 1247 114/64  ?   Pulse Rate 05/26/21 1247 74  ?   Resp 05/26/21 1247 20  ?   Temp 05/26/21 1247 98.9 ?F (37.2 ?C)  ?   Temp Source 05/26/21 1247 Oral  ?   SpO2 05/26/21 1247 96 %  ?   Weight 05/26/21 1249 170 lb (77.1 kg)  ?   Height 05/26/21 1249 4\' 11"  (1.499 m)  ?   Head Circumference --   ?   Peak Flow --   ?   Pain Score 05/26/21 1249 0  ?   Pain Loc --   ?   Pain Edu? --   ?   Excl. in GC? --   ? ?No data found. ? ?Updated  Vital Signs ?BP 114/64 (BP Location: Left Arm)   Pulse 74   Temp 98.9 ?F (37.2 ?C) (Oral)   Resp 20   Ht 4\' 11"  (1.499 m)   Wt 170 lb (77.1 kg)   LMP 05/17/2021   SpO2 96%   BMI 34.34 kg/m?  ? ?Visual Acuity ?Right Eye Distance:   ?Left Eye Distance:   ?Bilateral Distance:   ? ?Right Eye Near:   ?Left Eye Near:    ?Bilateral Near:    ? ?Physical Exam ?Constitutional:   ?   General: She is not in acute distress. ?   Appearance: Normal appearance. She is not toxic-appearing or diaphoretic.  ?HENT:  ?   Head: Normocephalic and atraumatic.  ?   Right Ear: Tympanic membrane and ear canal normal.  ?   Left Ear: Tympanic membrane and ear canal normal.  ?   Nose: Nose normal.  ?   Mouth/Throat:  ?   Mouth: Mucous membranes are moist.  ?   Pharynx: No posterior oropharyngeal erythema.  ?Eyes:  ?   Extraocular Movements: Extraocular movements intact.  ?   Conjunctiva/sclera: Conjunctivae normal.  ?   Pupils: Pupils are equal, round, and reactive to light.  ?Cardiovascular:  ?   Rate and Rhythm: Normal rate  and regular rhythm.  ?   Pulses: Normal pulses.  ?   Heart sounds: Normal heart sounds.  ?Pulmonary:  ?   Effort: Pulmonary effort is normal. No respiratory distress.  ?   Breath sounds: Normal breath sounds. No stridor. No wheezing, rhonchi or rales.  ?Neurological:  ?   General: No focal deficit present.  ?   Mental Status: She is alert and oriented to person, place, and time. Mental status is at baseline.  ?Psychiatric:     ?   Mood and Affect: Mood normal.     ?   Behavior: Behavior normal.     ?   Thought Content: Thought content normal.     ?   Judgment: Judgment normal.  ? ? ? ?UC Treatments / Results  ?Labs ?(all labs ordered are listed, but only abnormal results are displayed) ?Labs Reviewed - No data to display ? ?EKG ? ? ?Radiology ?No results found. ? ?Procedures ?Procedures (including critical care time) ? ?Medications Ordered in UC ?Medications - No data to display ? ?Initial Impression / Assessment and Plan / UC Course  ?I have reviewed the triage vital signs and the nursing notes. ? ?Pertinent labs & imaging results that were available during my care of the patient were reviewed by me and considered in my medical decision making (see chart for details). ? ?  ? ?Lung sounds are normal and no suspicion for any acute cardiopulmonary process.  Do not think that chest imaging is necessary at this time.  I do think the patient would benefit from prednisone to decrease inflammation associated with shortness of breath.  Pharmacy was called and patient does have albuterol inhaler awaiting her that was prescribed by previous healthcare provider.  Advised patient to pick up this albuterol inhaler to help.  Flonase was also prescribed by other healthcare provider but I do think patient would benefit more from prednisone, so advised patient to pick up albuterol and take prednisone.  Discussed strict return and ER precautions.  Patient is nontoxic-appearing and does not appear to be in need of immediate medical  attention at the hospital at this time.  Patient verbalized understanding and was agreeable plan. ?Final Clinical Impressions(s) /  UC Diagnoses  ? ?Final diagnoses:  ?COVID-19  ?Shortness of breath  ? ? ? ?Discharge Instructions   ? ?  ?You have been prescribed prednisone steroid to decrease inflammation and help alleviate your shortness of breath.  An albuterol inhaler has been prescribed by another healthcare provider and is waiting for you at the pharmacy. ? ? ? ?ED Prescriptions   ? ? Medication Sig Dispense Auth. Provider  ? predniSONE (DELTASONE) 20 MG tablet Take 2 tablets (40 mg total) by mouth daily for 5 days. 10 tablet Ervin Knack E, [place]  ? ?  ? ?PDMP not reviewed this encounter. ?  ?Gustavus Bryant, [place] ?05/26/21 1315 ? ?

## 2021-05-26 NOTE — Discharge Instructions (Signed)
You have been prescribed prednisone steroid to decrease inflammation and help alleviate your shortness of breath.  An albuterol inhaler has been prescribed by another healthcare provider and is waiting for you at the pharmacy. ?

## 2021-05-30 ENCOUNTER — Other Ambulatory Visit (HOSPITAL_COMMUNITY): Payer: Self-pay

## 2021-05-30 ENCOUNTER — Encounter: Payer: Self-pay | Admitting: Family Medicine

## 2021-05-30 ENCOUNTER — Ambulatory Visit (INDEPENDENT_AMBULATORY_CARE_PROVIDER_SITE_OTHER): Payer: No Typology Code available for payment source | Admitting: Family Medicine

## 2021-05-30 ENCOUNTER — Other Ambulatory Visit: Payer: Self-pay

## 2021-05-30 VITALS — BP 118/78 | HR 72 | Temp 98.7°F | Resp 16 | Ht 59.0 in | Wt 186.6 lb

## 2021-05-30 DIAGNOSIS — F17210 Nicotine dependence, cigarettes, uncomplicated: Secondary | ICD-10-CM

## 2021-05-30 DIAGNOSIS — Z7689 Persons encountering health services in other specified circumstances: Secondary | ICD-10-CM

## 2021-05-30 DIAGNOSIS — F329 Major depressive disorder, single episode, unspecified: Secondary | ICD-10-CM

## 2021-05-30 DIAGNOSIS — F17200 Nicotine dependence, unspecified, uncomplicated: Secondary | ICD-10-CM

## 2021-05-30 MED ORDER — BUPROPION HCL ER (XL) 150 MG PO TB24
150.0000 mg | ORAL_TABLET | Freq: Every day | ORAL | 1 refills | Status: DC
  Filled 2021-05-30: qty 30, 30d supply, fill #0

## 2021-05-30 MED ORDER — ALBUTEROL SULFATE HFA 108 (90 BASE) MCG/ACT IN AERS
2.0000 | INHALATION_SPRAY | Freq: Four times a day (QID) | RESPIRATORY_TRACT | 0 refills | Status: AC | PRN
  Filled 2021-05-30: qty 8.5, 25d supply, fill #0

## 2021-05-30 NOTE — Progress Notes (Signed)
? ?New Patient Office Visit ? ?Subjective:  ?Patient ID: [name], female    DOB: 21-Dec-1980  Age: 41 y.o. MRN: [id number] ? ?CC:  ?Chief Complaint  ?Patient presents with  ? Establish Care  ? ? ?HPI ?[name] presents for to establish care. Patient reports that she was dx with Covid on 05/18/2021  and was sent home from work. She reports that she has had increased social stressors from the lack of income. She is wanting to return to work. She reports fatigue but does not know if it is 2/2 recovering from covid or her mood. She had not taken meds prescribed for her covid sx 2/2 $$.  ? ?History reviewed. No pertinent past medical history. ? ?Past Surgical History:  ?Procedure Laterality Date  ? CESAREAN SECTION    ? ? ?Family History  ?Problem Relation Age of Onset  ? Diabetes Mother   ? Diabetes Father   ? Heart Problems Father   ? Hypertension Father   ? ? ?Social History  ? ?Socioeconomic History  ? Marital status: Divorced  ?  Spouse name: Not on file  ? Number of children: Not on file  ? Years of education: Not on file  ? Highest education level: Not on file  ?Occupational History  ? Not on file  ?Tobacco Use  ? Smoking status: Every Day  ?  Packs/day: 1.50  ?  Types: Cigarettes  ? Smokeless tobacco: Never  ?Substance and Sexual Activity  ? Alcohol use: Yes  ?  Comment: socially  ? Drug use: No  ? Sexual activity: Yes  ?  Birth control/protection: None  ?Other Topics Concern  ? Not on file  ?Social History Narrative  ? Not on file  ? ?Social Determinants of Health  ? ?Financial Resource Strain: Not on file  ?Food Insecurity: Not on file  ?Transportation Needs: Not on file  ?Physical Activity: Not on file  ?Stress: Not on file  ?Social Connections: Not on file  ?Intimate Partner Violence: Not on file  ? ? ?ROS ?Review of Systems  ?Constitutional:  Positive for activity change and fatigue. Negative for chills and fever.  ?Psychiatric/Behavioral:  Positive for sleep disturbance. Negative for  self-injury and suicidal ideas. The patient is not nervous/anxious.   ?All other systems reviewed and are negative. ? ?Objective:  ? ?Today's Vitals: BP 118/78   Pulse 72   Temp 98.7 ?F (37.1 ?C) (Oral)   Resp 16   Ht 4\' 11"  (1.499 m)   Wt 186 lb 9.6 oz (84.6 kg)   LMP 05/17/2021   SpO2 96%   BMI 37.69 kg/m?  ? ?Physical Exam ?Vitals and nursing note reviewed.  ?Constitutional:   ?   General: She is not in acute distress. ?Cardiovascular:  ?   Rate and Rhythm: Normal rate and regular rhythm.  ?Pulmonary:  ?   Effort: Pulmonary effort is normal.  ?   Breath sounds: Normal breath sounds.  ?Abdominal:  ?   Palpations: Abdomen is soft.  ?   Tenderness: There is no abdominal tenderness.  ?Neurological:  ?   General: No focal deficit present.  ?   Mental Status: She is alert and oriented to person, place, and time.  ?Psychiatric:     ?   Mood and Affect: Mood is anxious and depressed. Affect is angry.     ?   Speech: Speech normal.     ?   Behavior: Behavior normal. Behavior is not aggressive. Behavior is  cooperative.     ?   Thought Content: Thought content normal.  ? ? ?Assessment & Plan:  ? ?1. Reactive depression ?Wellbutrin prescribed. Patient referred to [facility] for counseling. Patient to return back to work.  ? ?2. Smoking ?Discussed cessation/reduction.  ? ?3. Encounter to establish care ? ? ? ? ? ?Outpatient Encounter Medications as of 05/30/2021  ?Medication Sig  ? buPROPion (WELLBUTRIN XL) 150 MG 24 hr tablet Take 1 tablet (150 mg total) by mouth daily.  ? albuterol (VENTOLIN HFA) 108 (90 Base) MCG/ACT inhaler Inhale 2 puffs into the lungs every 6 (six) hours as needed for wheezing or shortness of breath.  ? fluticasone (FLONASE) 50 MCG/ACT nasal spray Place 2 sprays into both nostrils daily. (Patient not taking: Reported on 05/30/2021)  ? predniSONE (DELTASONE) 20 MG tablet Take 2 tablets (40 mg total) by mouth daily for 5 days. (Patient not taking: Reported on 05/30/2021)  ? Spacer/Aero-Holding Chambers DEVI  1 each by Does not apply route as needed. (Patient not taking: Reported on 05/30/2021)  ? [DISCONTINUED] albuterol (VENTOLIN HFA) 108 (90 Base) MCG/ACT inhaler Inhale 2 puffs into the lungs every 6 (six) hours as needed for wheezing or shortness of breath. (Patient not taking: Reported on 05/30/2021)  ? ?No facility-administered encounter medications on file as of 05/30/2021.  ? ? ?Follow-up: No follow-ups on file.  ? ?Tommie Raymond, MD ? ?

## 2021-05-30 NOTE — Progress Notes (Signed)
Patient is here to establish care with provider. Patient said she has never had a PCP before. ? ?Patient is requesting STD from being out of work since 05/16/2021 with COVID. Per patient she is having long term COVID sx. Patient said that she is fatigue and having SOB. ? ?

## 2021-05-31 ENCOUNTER — Ambulatory Visit (INDEPENDENT_AMBULATORY_CARE_PROVIDER_SITE_OTHER): Payer: No Typology Code available for payment source | Admitting: Clinical

## 2021-05-31 DIAGNOSIS — F331 Major depressive disorder, recurrent, moderate: Secondary | ICD-10-CM | POA: Diagnosis not present
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
## 2021-06-21 ENCOUNTER — Ambulatory Visit: Payer: No Typology Code available for payment source | Admitting: Clinical

## 2021-06-28 ENCOUNTER — Ambulatory Visit: Payer: Medicaid Other | Admitting: Internal Medicine

## 2021-06-30 ENCOUNTER — Other Ambulatory Visit (HOSPITAL_COMMUNITY): Payer: Self-pay

## 2021-06-30 ENCOUNTER — Ambulatory Visit (INDEPENDENT_AMBULATORY_CARE_PROVIDER_SITE_OTHER): Payer: No Typology Code available for payment source | Admitting: Family Medicine

## 2021-06-30 ENCOUNTER — Encounter: Payer: Self-pay | Admitting: Family Medicine

## 2021-06-30 VITALS — BP 108/71 | HR 65 | Temp 97.9°F | Resp 16 | Wt 187.8 lb

## 2021-06-30 DIAGNOSIS — Z1159 Encounter for screening for other viral diseases: Secondary | ICD-10-CM

## 2021-06-30 DIAGNOSIS — Z130 Encounter for screening for diseases of the blood and blood-forming organs and certain disorders involving the immune mechanism: Secondary | ICD-10-CM

## 2021-06-30 DIAGNOSIS — Z0000 Encounter for general adult medical examination without abnormal findings: Secondary | ICD-10-CM | POA: Diagnosis not present

## 2021-06-30 DIAGNOSIS — F329 Major depressive disorder, single episode, unspecified: Secondary | ICD-10-CM

## 2021-06-30 DIAGNOSIS — Z1329 Encounter for screening for other suspected endocrine disorder: Secondary | ICD-10-CM | POA: Diagnosis not present

## 2021-06-30 DIAGNOSIS — Z13220 Encounter for screening for lipoid disorders: Secondary | ICD-10-CM

## 2021-06-30 DIAGNOSIS — Z13228 Encounter for screening for other metabolic disorders: Secondary | ICD-10-CM

## 2021-06-30 MED ORDER — BUPROPION HCL ER (XL) 150 MG PO TB24
150.0000 mg | ORAL_TABLET | Freq: Every day | ORAL | 1 refills | Status: DC
  Filled 2021-06-30: qty 90, 90d supply, fill #0

## 2021-06-30 NOTE — Progress Notes (Signed)
? ?Established Patient Office Visit ? ?Subjective:  ?Patient ID: [name], female    DOB: 1980-04-20  Age: 41 y.o. MRN: [id number] ? ?CC:  ?Chief Complaint  ?Patient presents with  ? Follow-up  ? Depression  ? ? ?HPI ?[name] presents for routine annual exam. Denies acute complaints or concerns.  ? ?No past medical history on file. ? ?Past Surgical History:  ?Procedure Laterality Date  ? CESAREAN SECTION    ? ? ?Family History  ?Problem Relation Age of Onset  ? Diabetes Mother   ? Diabetes Father   ? Heart Problems Father   ? Hypertension Father   ? ? ?Social History  ? ?Socioeconomic History  ? Marital status: Divorced  ?  Spouse name: Not on file  ? Number of children: Not on file  ? Years of education: Not on file  ? Highest education level: Not on file  ?Occupational History  ? Not on file  ?Tobacco Use  ? Smoking status: Every Day  ?  Packs/day: 1.50  ?  Types: Cigarettes  ? Smokeless tobacco: Never  ?Substance and Sexual Activity  ? Alcohol use: Yes  ?  Comment: socially  ? Drug use: No  ? Sexual activity: Yes  ?  Birth control/protection: None  ?Other Topics Concern  ? Not on file  ?Social History Narrative  ? Not on file  ? ?Social Determinants of Health  ? ?Financial Resource Strain: Not on file  ?Food Insecurity: Not on file  ?Transportation Needs: Not on file  ?Physical Activity: Not on file  ?Stress: Not on file  ?Social Connections: Not on file  ?Intimate Partner Violence: Not on file  ? ? ?ROS ?Review of Systems  ?All other systems reviewed and are negative. ? ?Objective:  ? ?Today's Vitals: BP 108/71   Pulse 65   Temp 97.9 ?F (36.6 ?C) (Oral)   Resp 16   Wt 187 lb 12.8 oz (85.2 kg)   SpO2 94%   BMI 37.93 kg/m?  ? ?Physical Exam ?Vitals and nursing note reviewed.  ?Constitutional:   ?   General: She is not in acute distress. ?HENT:  ?   Head: Normocephalic and atraumatic.  ?   Right Ear: Tympanic membrane, ear canal and external ear normal.  ?   Left Ear: Tympanic membrane, ear  canal and external ear normal.  ?   Nose: Nose normal.  ?   Mouth/Throat:  ?   Mouth: Mucous membranes are moist.  ?   Pharynx: Oropharynx is clear.  ?Eyes:  ?   Conjunctiva/sclera: Conjunctivae normal.  ?   Pupils: Pupils are equal, round, and reactive to light.  ?Neck:  ?   Thyroid: No thyromegaly.  ?Cardiovascular:  ?   Rate and Rhythm: Normal rate and regular rhythm.  ?   Heart sounds: Normal heart sounds. No murmur heard. ?Pulmonary:  ?   Effort: Pulmonary effort is normal. No respiratory distress.  ?   Breath sounds: Normal breath sounds.  ?Abdominal:  ?   General: There is no distension.  ?   Palpations: Abdomen is soft. There is no mass.  ?   Tenderness: There is no abdominal tenderness.  ?Musculoskeletal:     ?   General: Normal range of motion.  ?   Cervical back: Normal range of motion and neck supple.  ?Skin: ?   General: Skin is warm and dry.  ?Neurological:  ?   General: No focal deficit present.  ?  Mental Status: She is alert and oriented to person, place, and time.  ?Psychiatric:     ?   Mood and Affect: Mood normal.     ?   Behavior: Behavior normal.  ? ? ?Assessment & Plan:  ? ?1. Annual physical exam ?Routine labs ordered.  ?- CMP14+EGFR ? ?2. Screening for deficiency anemia ? ?- CBC with Differential ? ?3. Need for hepatitis C screening test ? ?- Hepatitis C Antibody ? ?4. Screening for endocrine/metabolic/immunity disorders ? ?- TSH ?- Hemoglobin A1c ?- Vitamin D, 25-hydroxy ? ?5. Screening for lipid disorders ? ?- Lipid Panel ? ?6. Reactive depression ?Improved. Continue present management and monitor. Meds refilled.  ? ? ?Outpatient Encounter Medications as of 06/30/2021  ?Medication Sig  ? albuterol (VENTOLIN HFA) 108 (90 Base) MCG/ACT inhaler Inhale 2 puffs into the lungs every 6  hours as needed for wheezing or shortness of breath.  ? [DISCONTINUED] buPROPion (WELLBUTRIN XL) 150 MG 24 hr tablet Take 1 tablet by mouth daily.  ? buPROPion (WELLBUTRIN XL) 150 MG 24 hr tablet Take 1 tablet by  mouth daily.  ? fluticasone (FLONASE) 50 MCG/ACT nasal spray Place 2 sprays into both nostrils daily. (Patient not taking: Reported on 05/30/2021)  ? Spacer/Aero-Holding Chambers DEVI 1 each by Does not apply route as needed. (Patient not taking: Reported on 05/30/2021)  ? ?No facility-administered encounter medications on file as of 06/30/2021.  ? ? ?Follow-up: Return in about 6 months (around 12/30/2021) for follow up.  ? ?Becky Sax, MD ? ?

## 2021-07-01 LAB — CMP14+EGFR
ALT: 10 IU/L (ref 0–32)
AST: 12 IU/L (ref 0–40)
Albumin/Globulin Ratio: 1.7 (ref 1.2–2.2)
Albumin: 4.3 g/dL (ref 3.8–4.8)
Alkaline Phosphatase: 60 IU/L (ref 44–121)
BUN/Creatinine Ratio: 20 (ref 9–23)
BUN: 14 mg/dL (ref 6–24)
Bilirubin Total: <0.2 mg/dL (ref 0.0–1.2)
CO2: 20 mmol/L (ref 20–29)
Calcium: 9.1 mg/dL (ref 8.7–10.2)
Chloride: 104 mmol/L (ref 96–106)
Creatinine, Ser: 0.69 mg/dL (ref 0.57–1.00)
Globulin, Total: 2.6 g/dL (ref 1.5–4.5)
Glucose: 102 mg/dL — ABNORMAL HIGH (ref 70–99)
Potassium: 4.4 mmol/L (ref 3.5–5.2)
Sodium: 140 mmol/L (ref 134–144)
Total Protein: 6.9 g/dL (ref 6.0–8.5)
eGFR: 111 mL/min/{1.73_m2} (ref >59)

## 2021-07-01 LAB — CBC WITH DIFFERENTIAL/PLATELET
Basophils Absolute: 0 10*3/uL (ref 0.0–0.2)
Basos: 1 %
EOS (ABSOLUTE): 0.1 10*3/uL (ref 0.0–0.4)
Eos: 2 %
Hematocrit: 36.1 % (ref 34.0–46.6)
Hemoglobin: 11.8 g/dL (ref 11.1–15.9)
Immature Grans (Abs): 0 10*3/uL (ref 0.0–0.1)
Immature Granulocytes: 0 %
Lymphocytes Absolute: 1.7 10*3/uL (ref 0.7–3.1)
Lymphs: 43 %
MCH: 31.3 pg (ref 26.6–33.0)
MCHC: 32.7 g/dL (ref 31.5–35.7)
MCV: 96 fL (ref 79–97)
Monocytes Absolute: 0.4 10*3/uL (ref 0.1–0.9)
Monocytes: 11 %
Neutrophils Absolute: 1.8 10*3/uL (ref 1.4–7.0)
Neutrophils: 43 %
Platelets: 296 10*3/uL (ref 150–450)
RBC: 3.77 x10E6/uL (ref 3.77–5.28)
RDW: 11.8 % (ref 11.7–15.4)
WBC: 4.1 10*3/uL (ref 3.4–10.8)

## 2021-07-01 LAB — TSH: TSH: 1.63 u[IU]/mL (ref 0.450–4.500)

## 2021-07-01 LAB — HEMOGLOBIN A1C
Est. average glucose Bld gHb Est-mCnc: 120 mg/dL
Hgb A1c MFr Bld: 5.8 % — ABNORMAL HIGH (ref 4.8–5.6)

## 2021-07-01 LAB — LIPID PANEL
Chol/HDL Ratio: 4.1 ratio (ref 0.0–4.4)
Cholesterol, Total: 167 mg/dL (ref 100–199)
HDL: 41 mg/dL (ref >39)
LDL Chol Calc (NIH): 109 mg/dL — ABNORMAL HIGH (ref 0–99)
Triglycerides: 94 mg/dL (ref 0–149)
VLDL Cholesterol Cal: 17 mg/dL (ref 5–40)

## 2021-07-01 LAB — HEPATITIS C ANTIBODY: Hep C Virus Ab: NONREACTIVE

## 2021-07-01 LAB — VITAMIN D 25 HYDROXY (VIT D DEFICIENCY, FRACTURES): Vit D, 25-Hydroxy: 9.4 ng/mL — ABNORMAL LOW (ref 30.0–100.0)

## 2021-07-08 ENCOUNTER — Other Ambulatory Visit (HOSPITAL_COMMUNITY): Payer: Self-pay

## 2021-07-12 ENCOUNTER — Other Ambulatory Visit: Payer: Self-pay | Admitting: Family Medicine

## 2021-07-12 ENCOUNTER — Other Ambulatory Visit (HOSPITAL_COMMUNITY): Payer: Self-pay

## 2021-07-12 MED ORDER — VITAMIN D (ERGOCALCIFEROL) 1.25 MG (50000 UNIT) PO CAPS
50000.0000 [IU] | ORAL_CAPSULE | ORAL | 0 refills | Status: DC
  Filled 2021-07-12 – 2021-07-22 (×2): qty 12, 84d supply, fill #0

## 2021-07-20 ENCOUNTER — Other Ambulatory Visit (HOSPITAL_COMMUNITY): Payer: Self-pay

## 2021-07-22 ENCOUNTER — Other Ambulatory Visit (HOSPITAL_COMMUNITY): Payer: Self-pay

## 2021-08-14 ENCOUNTER — Telehealth: Payer: No Typology Code available for payment source | Admitting: Nurse Practitioner

## 2021-08-14 DIAGNOSIS — R21 Rash and other nonspecific skin eruption: Secondary | ICD-10-CM | POA: Diagnosis not present

## 2021-08-14 MED ORDER — NYSTATIN 100000 UNIT/GM EX POWD
1.0000 "application " | Freq: Two times a day (BID) | CUTANEOUS | 0 refills | Status: AC
  Filled 2021-08-14: qty 45, 23d supply, fill #0

## 2021-08-14 NOTE — Progress Notes (Signed)

## 2021-08-15 ENCOUNTER — Other Ambulatory Visit (HOSPITAL_COMMUNITY): Payer: Self-pay

## 2021-08-16 ENCOUNTER — Other Ambulatory Visit (HOSPITAL_COMMUNITY): Payer: Self-pay

## 2021-08-17 ENCOUNTER — Ambulatory Visit: Payer: No Typology Code available for payment source | Admitting: Family Medicine

## 2021-10-19 ENCOUNTER — Ambulatory Visit: Payer: No Typology Code available for payment source | Admitting: Podiatry

## 2021-11-07 ENCOUNTER — Ambulatory Visit: Payer: Self-pay

## 2021-11-07 NOTE — Telephone Encounter (Signed)
Reason for Disposition  [1] MODERATE pain (e.g., interferes with normal activities, limping) AND [2] present > 3 days  Answer Assessment - Initial Assessment Questions 1. ONSET: "When did the pain start?"      Right foot is stiff.   My foot is going to the side.   If I keep walking on it it goes away.   When sit down not feel it.   When stand up it hurts but if keep walking it goes away 2. LOCATION: "Where is the pain located?"      Right foot around the ankle is stiff and painful 3. PAIN: "How bad is the pain?"    (Scale 1-10; or mild, moderate, severe)  - MILD (1-3): doesn't interfere with normal activities.   - MODERATE (4-7): interferes with normal activities (e.g., work or school) or awakens from sleep, limping.   - SEVERE (8-10): excruciating pain, unable to do any normal activities, unable to walk.      Moderate 4. WORK OR EXERCISE: "Has there been any recent work or exercise that involved this part of the body?"      No 5. CAUSE: "What do you think is causing the foot pain?"     My foot turns in a little maybe that is causing it I don't know. 6. OTHER SYMPTOMS: "Do you have any other symptoms?" (e.g., leg pain, rash, fever, numbness)     Having discomfort from knee to foot.  7. PREGNANCY: "Is there any chance you are pregnant?" "When was your last menstrual period?"     Not asked  Protocols used: Foot Pain-A-AH  Chief Complaint: Right foot is painful and right knee with discomfort Symptoms: When first start walking it is painful but as she walks the foot pain gets better.   Bad first thing in the morning. Frequency: Daily Pertinent Negatives: Patient denies shooting pain Disposition: [] ED /[] Urgent Care (no appt availability in office) / [x] Appointment(In office/virtual)/ []  [facility]/ [] Home Care/ [] Refused Recommended Disposition /[]  Mobile Bus/ []  Follow-up with PCP Additional Notes: Appt. Made with Dr. on 11/24/2021 at 8:40.

## 2021-11-07 NOTE — Telephone Encounter (Signed)
Summary: Stiffness in foot, no appt   Pt called to report that she is experiencing stiffness in her right foot, sometimes hurts to walk. Experiencing discomfort from her knee down to her foot. No appt available   Best contact: [phone]     Called pt - [id number] 

## 2021-11-07 NOTE — Telephone Encounter (Signed)
Summary: Stiffness in foot, no appt   Pt called to report that she is experiencing stiffness in her right foot, sometimes hurts to walk. Experiencing discomfort from her knee down to her foot. No appt available   Best contact: [phone]     Called pt - [facility]

## 2021-11-24 ENCOUNTER — Ambulatory Visit: Payer: No Typology Code available for payment source | Admitting: Family Medicine

## 2022-01-27 ENCOUNTER — Ambulatory Visit (INDEPENDENT_AMBULATORY_CARE_PROVIDER_SITE_OTHER): Payer: No Typology Code available for payment source | Admitting: Family Medicine

## 2022-01-27 ENCOUNTER — Telehealth: Payer: No Typology Code available for payment source | Admitting: Physician Assistant

## 2022-01-27 ENCOUNTER — Other Ambulatory Visit (HOSPITAL_COMMUNITY)
Admission: RE | Admit: 2022-01-27 | Discharge: 2022-01-27 | Disposition: A | Discharge status: Home / Self Care | Payer: No Typology Code available for payment source | Source: Ambulatory Visit | Attending: Family Medicine | Admitting: Family Medicine

## 2022-01-27 ENCOUNTER — Other Ambulatory Visit (HOSPITAL_COMMUNITY): Payer: Self-pay

## 2022-01-27 ENCOUNTER — Encounter: Payer: Self-pay | Admitting: Family Medicine

## 2022-01-27 VITALS — BP 108/73 | HR 74 | Temp 98.1°F | Resp 16 | Wt 190.4 lb

## 2022-01-27 DIAGNOSIS — N760 Acute vaginitis: Secondary | ICD-10-CM | POA: Insufficient documentation

## 2022-01-27 DIAGNOSIS — F17210 Nicotine dependence, cigarettes, uncomplicated: Secondary | ICD-10-CM

## 2022-01-27 DIAGNOSIS — F17200 Nicotine dependence, unspecified, uncomplicated: Secondary | ICD-10-CM

## 2022-01-27 DIAGNOSIS — B3731 Acute candidiasis of vulva and vagina: Secondary | ICD-10-CM

## 2022-01-27 MED ORDER — NICOTINE 21 MG/24HR TD PT24
21.0000 mg | MEDICATED_PATCH | Freq: Every day | TRANSDERMAL | 1 refills | Status: DC
  Filled 2022-01-27: qty 28, 28d supply, fill #0

## 2022-01-27 MED ORDER — FLUCONAZOLE 150 MG PO TABS
150.0000 mg | ORAL_TABLET | ORAL | 0 refills | Status: DC | PRN
  Filled 2022-01-27: qty 2, 6d supply, fill #0

## 2022-01-27 NOTE — Progress Notes (Signed)

## 2022-01-30 LAB — CERVICOVAGINAL ANCILLARY ONLY
Bacterial Vaginitis (gardnerella): POSITIVE — AB
Candida Glabrata: NEGATIVE
Candida Vaginitis: POSITIVE — AB
Chlamydia: NEGATIVE
Comment: NEGATIVE
Comment: NEGATIVE
Comment: NEGATIVE
Comment: NEGATIVE
Comment: NEGATIVE
Comment: NORMAL
Neisseria Gonorrhea: NEGATIVE
Trichomonas: NEGATIVE

## 2022-01-31 ENCOUNTER — Other Ambulatory Visit: Payer: Self-pay | Admitting: Family Medicine

## 2022-01-31 ENCOUNTER — Other Ambulatory Visit (HOSPITAL_COMMUNITY): Payer: Self-pay

## 2022-01-31 ENCOUNTER — Encounter: Payer: Self-pay | Admitting: Family Medicine

## 2022-01-31 MED ORDER — METRONIDAZOLE 500 MG PO TABS
500.0000 mg | ORAL_TABLET | Freq: Two times a day (BID) | ORAL | 0 refills | Status: AC
  Filled 2022-01-31: qty 14, 7d supply, fill #0

## 2022-01-31 NOTE — Progress Notes (Signed)
Established Patient Office Visit  Subjective    Patient ID: [name], female    DOB: 14-Feb-1981  Age: 41 y.o. MRN: [id number]  CC:  Chief Complaint  Patient presents with   Gynecologic Exam   Exposure to STD    HPI [name] presents for complaint of possible yeast vaginitis. She also would like patch to help stop smoking.    Outpatient Encounter Medications as of 01/27/2022  Medication Sig   albuterol (VENTOLIN HFA) 108 (90 Base) MCG/ACT inhaler Inhale 2 puffs into the lungs every 6  hours as needed for wheezing or shortness of breath.   buPROPion (WELLBUTRIN XL) 150 MG 24 hr tablet Take 1 tablet by mouth daily.   fluconazole (DIFLUCAN) 150 MG tablet Take 1 tablet (150 mg total) by mouth every 3 (three) days as needed.   fluticasone (FLONASE) 50 MCG/ACT nasal spray Place 2 sprays into both nostrils daily.   nicotine (NICODERM CQ) 21 mg/24hr patch Place 1 patch (21 mg total) onto the skin daily.   Spacer/Aero-Holding Chambers DEVI 1 each by Does not apply route as needed.   Vitamin D, Ergocalciferol, (DRISDOL) 1.25 MG (50000 UNIT) CAPS capsule Take 1 capsule by mouth every 7 days.   No facility-administered encounter medications on file as of 01/27/2022.    History reviewed. No pertinent past medical history.  Past Surgical History:  Procedure Laterality Date   CESAREAN SECTION      Family History  Problem Relation Age of Onset   Diabetes Mother    Diabetes Father    Heart Problems Father    Hypertension Father     Social History   Socioeconomic History   Marital status: Divorced    Spouse name: Not on file   Number of children: Not on file   Years of education: Not on file   Highest education level: Not on file  Occupational History   Not on file  Tobacco Use   Smoking status: Every Day    Packs/day: 1.50    Types: Cigarettes   Smokeless tobacco: Never  Substance and Sexual Activity   Alcohol use: Yes    Comment: socially   Drug use: No    Sexual activity: Yes    Birth control/protection: None  Other Topics Concern   Not on file  Social History Narrative   Not on file   Social Determinants of Health   Financial Resource Strain: Not on file  Food Insecurity: Not on file  Transportation Needs: Not on file  Physical Activity: Not on file  Stress: Not on file  Social Connections: Not on file  Intimate Partner Violence: Not on file    Review of Systems  All other systems reviewed and are negative.       Objective    BP 108/73   Pulse 74   Temp 98.1 F (36.7 C) (Oral)   Resp 16   Wt 190 lb 6.4 oz (86.4 kg)   SpO2 96%   BMI 38.46 kg/m   Physical Exam Vitals and nursing note reviewed.  Constitutional:      General: She is not in acute distress. Cardiovascular:     Rate and Rhythm: Normal rate and regular rhythm.  Pulmonary:     Effort: Pulmonary effort is normal.     Breath sounds: Normal breath sounds.  Abdominal:     Palpations: Abdomen is soft.     Tenderness: There is no abdominal tenderness.  Neurological:     General:  No focal deficit present.     Mental Status: She is alert and oriented to person, place, and time.         Assessment & Plan:   1. Vaginitis and vulvovaginitis Self swab test pending.  - Cervicovaginal ancillary only  2. Smoking Nicoderm patch prescribed  No follow-ups on file.   Becky Sax, MD

## 2022-02-17 ENCOUNTER — Other Ambulatory Visit (HOSPITAL_COMMUNITY): Payer: Self-pay

## 2022-02-19 ENCOUNTER — Telehealth: Payer: No Typology Code available for payment source | Admitting: Nurse Practitioner

## 2022-02-19 DIAGNOSIS — B3731 Acute candidiasis of vulva and vagina: Secondary | ICD-10-CM | POA: Diagnosis not present

## 2022-02-19 MED ORDER — FLUCONAZOLE 150 MG PO TABS
150.0000 mg | ORAL_TABLET | ORAL | 0 refills | Status: DC | PRN
  Filled 2022-02-19: qty 2, 6d supply, fill #0

## 2022-02-19 NOTE — Progress Notes (Signed)
I have spent 5 minutes in review of e-visit questionnaire, review and updating patient chart, medical decision making and response to patient.  ° °Braylen Staller W Blayne Garlick, NP ° °  °

## 2022-02-19 NOTE — Progress Notes (Signed)

## 2022-02-20 ENCOUNTER — Other Ambulatory Visit (HOSPITAL_COMMUNITY): Payer: Self-pay

## 2022-02-21 ENCOUNTER — Ambulatory Visit: Payer: Self-pay | Admitting: *Deleted

## 2022-02-21 ENCOUNTER — Telehealth: Payer: No Typology Code available for payment source | Admitting: Family Medicine

## 2022-02-21 ENCOUNTER — Emergency Department (HOSPITAL_COMMUNITY)
Admission: EM | Admit: 2022-02-21 | Discharge: 2022-02-22 | Disposition: A | Discharge status: Home / Self Care | Payer: No Typology Code available for payment source | Attending: Emergency Medicine | Admitting: Emergency Medicine

## 2022-02-21 ENCOUNTER — Emergency Department (HOSPITAL_COMMUNITY)
Admission: EM | Admit: 2022-02-21 | Discharge: 2022-02-21 | Discharge status: Against Medical Advice | Payer: No Typology Code available for payment source | Attending: Emergency Medicine | Admitting: Emergency Medicine

## 2022-02-21 ENCOUNTER — Encounter: Payer: No Typology Code available for payment source | Admitting: Family

## 2022-02-21 ENCOUNTER — Other Ambulatory Visit: Payer: Self-pay

## 2022-02-21 DIAGNOSIS — B3731 Acute candidiasis of vulva and vagina: Secondary | ICD-10-CM | POA: Diagnosis not present

## 2022-02-21 DIAGNOSIS — N898 Other specified noninflammatory disorders of vagina: Secondary | ICD-10-CM | POA: Insufficient documentation

## 2022-02-21 DIAGNOSIS — N760 Acute vaginitis: Secondary | ICD-10-CM | POA: Insufficient documentation

## 2022-02-21 DIAGNOSIS — Z5321 Procedure and treatment not carried out due to patient leaving prior to being seen by health care provider: Secondary | ICD-10-CM | POA: Insufficient documentation

## 2022-02-21 DIAGNOSIS — B9689 Other specified bacterial agents as the cause of diseases classified elsewhere: Secondary | ICD-10-CM

## 2022-02-21 LAB — URINALYSIS, ROUTINE W REFLEX MICROSCOPIC
Bilirubin Urine: NEGATIVE
Glucose, UA: NEGATIVE mg/dL
Ketones, ur: NEGATIVE mg/dL
Nitrite: NEGATIVE
Protein, ur: 30 mg/dL — AB
RBC / HPF: >50 RBC/hpf — ABNORMAL HIGH (ref 0–5)
Specific Gravity, Urine: 1.014 (ref 1.005–1.030)
pH: 5 (ref 5.0–8.0)

## 2022-02-21 LAB — WET PREP, GENITAL
Sperm: NONE SEEN
Trich, Wet Prep: NONE SEEN
WBC, Wet Prep HPF POC: <10 (ref <10)

## 2022-02-21 LAB — POC URINE PREG, ED: Preg Test, Ur: NEGATIVE

## 2022-02-21 MED ORDER — FLUCONAZOLE 150 MG PO TABS
150.0000 mg | ORAL_TABLET | Freq: Every day | ORAL | 0 refills | Status: AC
  Filled 2022-02-21 – 2022-02-24 (×3): qty 2, 2d supply, fill #0

## 2022-02-21 MED ORDER — CLINDAMYCIN PHOSPHATE 2 % VA CREA
1.0000 | TOPICAL_CREAM | Freq: Every day | VAGINAL | 0 refills | Status: DC
  Filled 2022-02-21: qty 40, 7d supply, fill #0

## 2022-02-21 NOTE — ED Triage Notes (Signed)
Patient coming to ED for evaluation of vaginal discharge.  Reports she was recently treated for BV.  Symptoms worsened within the last few days.  C/o vaginal itching, dysuria, and urine frequency.

## 2022-02-21 NOTE — Discharge Instructions (Signed)
Please follow-up with gynecology and PCP. Make sure you take the antibiotics daily and take one yeast pill in the beginning of your treatment and once after your last antibiotic cream application.

## 2022-02-21 NOTE — Progress Notes (Signed)
Erroneous encounter-disregard

## 2022-02-21 NOTE — ED Provider Notes (Signed)
[facility]-EMERGENCY DEPT Provider Note   CSN: [id number] Arrival date & time: 02/21/22  2202     History  Chief Complaint  Patient presents with   Vaginal Discharge    [name] is a 41 y.o. female, c/o of thin clear-grey discharge for the last few days. Also reports dysuria, increased urinary frequency and urgency. Reports itching around vagina. No sores. No fever, back pain, abdominal pain.      Home Medications Prior to Admission medications   Medication Sig Start Date End Date Taking? Authorizing Provider  clindamycin (CLEOCIN) 2 % vaginal cream Place 1 Applicatorful vaginally at bedtime. 02/21/22  Yes Shyanna Klingel L, PA  fluconazole (DIFLUCAN) 150 MG tablet Take 1 tablet (150 mg total) by mouth daily. 02/21/22  Yes Gust Eugene L, PA  albuterol (VENTOLIN HFA) 108 (90 Base) MCG/ACT inhaler Inhale 2 puffs into the lungs every 6  hours as needed for wheezing or shortness of breath. 05/30/21   Georganna Skeans, MD  buPROPion (WELLBUTRIN XL) 150 MG 24 hr tablet Take 1 tablet by mouth daily. 06/30/21   Georganna Skeans, MD  fluticasone [facility]) 50 MCG/ACT nasal spray Place 2 sprays into both nostrils daily. 05/25/21   Cathlyn Parsons, NP  nicotine (NICODERM CQ) 21 mg/24hr patch Place 1 patch (21 mg total) onto the skin daily. 01/27/22   Georganna Skeans, MD  Spacer/Aero-Holding Deretha Emory DEVI 1 each by Does not apply route as needed. 05/25/21   Cathlyn Parsons, NP  Vitamin D, Ergocalciferol, (DRISDOL) 1.25 MG (50000 UNIT) CAPS capsule Take 1 capsule by mouth every 7 days. 07/12/21   Georganna Skeans, MD      Allergies    Benadryl [diphenhydramine hcl]    Review of Systems   Review of Systems  Genitourinary:  Positive for dysuria, frequency, urgency and vaginal discharge.    Physical Exam Updated Vital Signs BP (!) 129/52 (BP Location: Right Arm)   Pulse 79   Temp 98.1 F (36.7 C) (Oral)   Resp 16   Ht 4\' 11"  (1.499 m)   Wt 86.2 kg   SpO2 100%   BMI 38.38  kg/m  Physical Exam Vitals and nursing note reviewed.  Constitutional:      General: She is not in acute distress.    Appearance: She is well-developed.  HENT:     Head: Normocephalic and atraumatic.  Eyes:     Conjunctiva/sclera: Conjunctivae normal.  Cardiovascular:     Rate and Rhythm: Normal rate and regular rhythm.     Heart sounds: No murmur heard. Pulmonary:     Effort: Pulmonary effort is normal. No respiratory distress.     Breath sounds: Normal breath sounds.  Abdominal:     Palpations: Abdomen is soft.     Tenderness: There is no abdominal tenderness.  Musculoskeletal:        General: No swelling.     Cervical back: Neck supple.  Skin:    General: Skin is warm and dry.     Capillary Refill: Capillary refill takes less than 2 seconds.  Neurological:     Mental Status: She is alert.  Psychiatric:        Mood and Affect: Mood normal.     ED Results / Procedures / Treatments   Labs (all labs ordered are listed, but only abnormal results are displayed) Labs Reviewed  WET PREP, GENITAL - Abnormal; Notable for the following components:      Result Value   Yeast Wet Prep  HPF POC PRESENT (*)    Clue Cells Wet Prep HPF POC PRESENT (*)    All other components within normal limits  URINALYSIS, ROUTINE W REFLEX MICROSCOPIC - Abnormal; Notable for the following components:   APPearance HAZY (*)    Hgb urine dipstick LARGE (*)    Protein, ur 30 (*)    Leukocytes,Ua Bridgette Wolden (*)    RBC / HPF >50 (*)    Bacteria, UA RARE (*)    All other components within normal limits  URINE CULTURE  POC URINE PREG, ED  GC/CHLAMYDIA PROBE AMP ([place]) NOT AT [facility]    EKG None  Radiology No results found.  Procedures Procedures  Medications Ordered in ED Medications - No data to display  ED Course/ Medical Decision Making/ A&P                           Medical Decision Making Patient is 41 y.o. female, here for vaginal itching and discharge. Just finished flagyl  prescription 4 days ago. Was previously treated for yeast infection as well.C/o dysuria, vaginal discharge. Will obtain wet prep, GC per pt request, and urinalysis.  Amount and/or Complexity of Data Reviewed Labs: ordered.    Details: +clue cells, yeast Discussion of management or test interpretation with external provider(s): Failed flagyl outpatient, will treat with clindamycin cream instead, will have here repeat course of fluconazole. Return precautions emphasized. She would like to wait for results of GC before treating.   Risk Prescription drug management.   Final Clinical Impression(s) / ED Diagnoses Final diagnoses:  BV (bacterial vaginosis)  Vaginal yeast infection    Rx / DC Orders ED Discharge Orders          Ordered    clindamycin (CLEOCIN) 2 % vaginal cream  Daily at bedtime        02/21/22 2348    fluconazole (DIFLUCAN) 150 MG tablet  Daily        02/21/22 2348              Seidy Labreck, [place], PA 02/21/22 2354    Benjiman Core, MD 02/22/22 1452

## 2022-02-21 NOTE — Telephone Encounter (Signed)
Summary: STD / antibiotic   Pts partner called her and advised that he had Trich and she missed the appt scheduled for her this morning and would like an antibiotic / please advise

## 2022-02-21 NOTE — Telephone Encounter (Signed)
  Chief Complaint: requesting antibiotics  Symptoms: vaginal discharge clear to gray in color. Partner tested positive for Trichomonas and notified her. Patient thought she had yeast infection and has taken 1 dose of diflucan. Missed appt this am due to child care issues keeping grandchild.  Frequency: approx. 1 week or more  Pertinent Negatives: Patient denies fever. Disposition: [] ED /[] Urgent Care (no appt availability in office) / [] Appointment(In office/virtual)/ []  [place] [facility]/ [] Home Care/ [] Refused Recommended Disposition /[x] [facility]/ []  Follow-up with PCP Additional Notes:   No available appt with PCP until after Dec. Recommended mobile bus Patient requesting antibiotics due to partner having dx trichomonas. Has taken 1 dose of diflucan and is scheduled to take the other dose Thursday. Please advise if patient should continue to take the other dose of diflucan Thursday. Please advise  Reason for Disposition  Abnormal color vaginal discharge (i.e., yellow, green, gray)  Answer Assessment - Initial Assessment Questions 1. DISCHARGE: "Describe the discharge." (e.g., white, yellow, green, gray, foamy, cottage cheese-like)     Grayish clear discharge 2. ODOR: "Is there a bad odor?"     na 3. ONSET: "When did the discharge begin?"     na 4. RASH: "Is there a rash in the genital area?" If Yes, ask: "Describe it." (e.g., redness, blisters, sores, bumps)     Itching  5. ABDOMEN PAIN: "Are you having any abdomen pain?" If Yes, ask: "What does it feel like? " (e.g., crampy, dull, intermittent, constant)      na 6. ABDOMEN PAIN SEVERITY: If present, ask: "How bad is it?" (e.g., Scale 1-10; mild, moderate, or severe)   - MILD (1-3): Doesn't interfere with normal activities, abdomen soft and not tender to touch.    - MODERATE (4-7): Interferes with normal activities or awakens from sleep, abdomen tender to touch.    - SEVERE (8-10): Excruciating pain, doubled over,  unable to do any normal activities. (R/O peritonitis)      na 7. CAUSE: "What do you think is causing the discharge?" "Have you had the same problem before? What happened then?"     Partner tested positive for Trichomonas 8. OTHER SYMPTOMS: "Do you have any other symptoms?" (e.g., fever, itching, vaginal bleeding, pain with urination, injury to genital area, vaginal foreign body)     Itching , discharge clear, grayish .  9. PREGNANCY: "Is there any chance you are pregnant?" "When was your last menstrual period?"     na  Protocols used: Vaginal Discharge-A-AH

## 2022-02-21 NOTE — Telephone Encounter (Signed)
Pt was scheduled for an appt today for vaginal discharge did not show

## 2022-02-21 NOTE — Progress Notes (Signed)
Because recent yeast treatment that did not improve and or return of symptoms, I feel your condition warrants further evaluation and I recommend that you be seen in a face to face visit.   NOTE: There will be NO CHARGE for this eVisit

## 2022-02-22 ENCOUNTER — Other Ambulatory Visit (HOSPITAL_COMMUNITY): Payer: Self-pay

## 2022-02-22 LAB — GC/CHLAMYDIA PROBE AMP (~~LOC~~) NOT AT ARMC
Chlamydia: NEGATIVE
Comment: NEGATIVE
Comment: NORMAL
Neisseria Gonorrhea: NEGATIVE

## 2022-02-23 ENCOUNTER — Other Ambulatory Visit (HOSPITAL_COMMUNITY): Payer: Self-pay

## 2022-02-23 LAB — URINE CULTURE

## 2022-02-24 ENCOUNTER — Other Ambulatory Visit (HOSPITAL_COMMUNITY): Payer: Self-pay

## 2022-02-25 ENCOUNTER — Other Ambulatory Visit (HOSPITAL_COMMUNITY): Payer: Self-pay

## 2022-05-07 ENCOUNTER — Emergency Department (HOSPITAL_COMMUNITY)
Admission: EM | Admit: 2022-05-07 | Discharge: 2022-05-07 | Disposition: A | Discharge status: Home / Self Care | Payer: Medicaid Other | Attending: Emergency Medicine | Admitting: Emergency Medicine

## 2022-05-07 DIAGNOSIS — R0981 Nasal congestion: Secondary | ICD-10-CM | POA: Diagnosis present

## 2022-05-07 DIAGNOSIS — U071 COVID-19: Secondary | ICD-10-CM | POA: Diagnosis not present

## 2022-05-07 DIAGNOSIS — J111 Influenza due to unidentified influenza virus with other respiratory manifestations: Secondary | ICD-10-CM

## 2022-05-07 LAB — RESP PANEL BY RT-PCR (RSV, FLU A&B, COVID)  RVPGX2
Influenza A by PCR: NEGATIVE
Influenza B by PCR: NEGATIVE
Resp Syncytial Virus by PCR: NEGATIVE
SARS Coronavirus 2 by RT PCR: POSITIVE — AB

## 2022-05-07 MED ORDER — ACETAMINOPHEN 500 MG PO TABS
1000.0000 mg | ORAL_TABLET | Freq: Once | ORAL | Status: AC
  Administered 2022-05-07: 1000 mg via ORAL
  Filled 2022-05-07: qty 2

## 2022-05-07 NOTE — ED Provider Notes (Signed)
[place] EMERGENCY DEPARTMENT AT [facility] Provider Note   CSN: [id number] Arrival date & time: 05/07/22  1258     History  Chief Complaint  Patient presents with   Generalized Body Aches    MELECIA CREMEANS is a 42 y.o. female.  Patient is a 42 year old female who presents with flulike symptoms.  She said it started 2 days ago.  She has had some runny nose congestion and bodyaches for about 2 days.  She also has a cough but no shortness of breath or noticeable chest congestion.  No known fevers.  No vomiting or diarrhea.  She has not taken anything today for the pain.       Home Medications Prior to Admission medications   Medication Sig Start Date End Date Taking? Authorizing Provider  albuterol (VENTOLIN HFA) 108 (90 Base) MCG/ACT inhaler Inhale 2 puffs into the lungs every 6  hours as needed for wheezing or shortness of breath. 05/30/21   Dorna Mai, MD  buPROPion (WELLBUTRIN XL) 150 MG 24 hr tablet Take 1 tablet by mouth daily. 06/30/21   Dorna Mai, MD  clindamycin (CLEOCIN) 2 % vaginal cream Place 1 Applicatorful vaginally at bedtime. 02/21/22   Small, Brooke L, PA  fluconazole (DIFLUCAN) 150 MG tablet Take 1 tablet (150 mg total) by mouth daily. 02/21/22   Small, Brooke L, PA  fluticasone (FLONASE) 50 MCG/ACT nasal spray Place 2 sprays into both nostrils daily. 05/25/21   Carvel Getting, NP  nicotine (NICODERM CQ) 21 mg/24hr patch Place 1 patch (21 mg total) onto the skin daily. 01/27/22   Dorna Mai, MD  Spacer/Aero-Holding Josiah Lobo DEVI 1 each by Does not apply route as needed. 05/25/21   Carvel Getting, NP  Vitamin D, Ergocalciferol, (DRISDOL) 1.25 MG (50000 UNIT) CAPS capsule Take 1 capsule by mouth every 7 days. 07/12/21   Dorna Mai, MD      Allergies    Benadryl [diphenhydramine hcl]    Review of Systems   Review of Systems  Constitutional:  Positive for fatigue. Negative for chills, diaphoresis and fever.  HENT:  Positive for congestion  and rhinorrhea. Negative for sneezing.   Eyes: Negative.   Respiratory:  Positive for cough. Negative for chest tightness and shortness of breath.   Cardiovascular:  Negative for chest pain and leg swelling.  Gastrointestinal:  Negative for abdominal pain, blood in stool, diarrhea, nausea and vomiting.  Genitourinary:  Negative for difficulty urinating, flank pain, frequency and hematuria.  Musculoskeletal:  Positive for myalgias. Negative for arthralgias and back pain.  Skin:  Negative for rash.  Neurological:  Negative for dizziness, speech difficulty, weakness, numbness and headaches.    Physical Exam Updated Vital Signs BP 121/74 (BP Location: Left Arm)   Pulse 75   Temp 98.5 F (36.9 C) (Oral)   Resp 16   Ht 4' 11"$  (1.499 m)   SpO2 100%   BMI 38.38 kg/m  Physical Exam Constitutional:      Appearance: She is well-developed.  HENT:     Head: Normocephalic and atraumatic.  Eyes:     Pupils: Pupils are equal, round, and reactive to light.  Neck:     Comments: No meningismus Cardiovascular:     Rate and Rhythm: Normal rate and regular rhythm.     Heart sounds: Normal heart sounds.  Pulmonary:     Effort: Pulmonary effort is normal. No respiratory distress.     Breath sounds: Normal breath sounds. No wheezing or rales.  Chest:     Chest wall: No tenderness.  Abdominal:     General: Bowel sounds are normal.     Palpations: Abdomen is soft.     Tenderness: There is no abdominal tenderness. There is no guarding or rebound.  Musculoskeletal:        General: Normal range of motion.     Cervical back: Normal range of motion and neck supple.  Lymphadenopathy:     Cervical: No cervical adenopathy.  Skin:    General: Skin is warm and dry.     Findings: No rash.  Neurological:     Mental Status: She is alert and oriented to person, place, and time.     ED Results / Procedures / Treatments   Labs (all labs ordered are listed, but only abnormal results are  displayed) Labs Reviewed  RESP PANEL BY RT-PCR (RSV, FLU A&B, COVID)  [id number]    EKG None  Radiology No results found.  Procedures Procedures    Medications Ordered in ED Medications  acetaminophen (TYLENOL) tablet 1,000 mg (1,000 mg Oral Given 05/07/22 1323)    ED Course/ Medical Decision Making/ A&P                             Medical Decision Making Risk OTC drugs.   Patient is a 42 year old female who presents with flulike symptoms.  She is overall well-appearing.  She is afebrile.  She does not have any suggestions of meningitis.  No clinical suggestions of pneumonia.  No hypoxia.  No signs of dehydration.  She appears appropriate for discharge.  No indication for hospitalization.  Will go ahead and do a respiratory viral panel.  She does not have any significant past medical history per her report and therefore would not require antiviral treatment.  She has opted to be discharged and will follow-up the results on MyChart.  She is also going to contact health at work.  Final Clinical Impression(s) / ED Diagnoses Final diagnoses:  Influenza-like illness    Rx / DC Orders ED Discharge Orders     None         Malvin Johns, MD 05/07/22 1333

## 2022-05-07 NOTE — ED Triage Notes (Signed)
Pt presents to ED from home C/O sore throat, body aches, runny nose X 2 days. Denies fevers.

## 2022-06-22 ENCOUNTER — Other Ambulatory Visit: Payer: Self-pay

## 2022-06-22 ENCOUNTER — Emergency Department (HOSPITAL_COMMUNITY)
Admission: EM | Admit: 2022-06-22 | Discharge: 2022-06-22 | Disposition: A | Discharge status: Home / Self Care | Payer: PRIVATE HEALTH INSURANCE | Attending: Emergency Medicine | Admitting: Emergency Medicine

## 2022-06-22 ENCOUNTER — Encounter (HOSPITAL_COMMUNITY): Payer: Self-pay

## 2022-06-22 DIAGNOSIS — T528X1A Toxic effect of other organic solvents, accidental (unintentional), initial encounter: Secondary | ICD-10-CM | POA: Diagnosis not present

## 2022-06-22 DIAGNOSIS — H10213 Acute toxic conjunctivitis, bilateral: Secondary | ICD-10-CM | POA: Diagnosis not present

## 2022-06-22 DIAGNOSIS — H538 Other visual disturbances: Secondary | ICD-10-CM | POA: Diagnosis present

## 2022-06-22 MED ORDER — EYE DROPS 0.5 % OP SOLN: 2.0000 [drp] | Freq: Four times a day (QID) | OPHTHALMIC | 0 refills | Status: DC

## 2022-06-22 MED ORDER — ERYTHROMYCIN 5 MG/GM OP OINT: TOPICAL_OINTMENT | OPHTHALMIC | 0 refills | Status: DC

## 2022-06-22 NOTE — ED Provider Notes (Signed)
[place] EMERGENCY DEPARTMENT AT [facility] Provider Note   CSN: [id number] Arrival date & time: 06/22/22  1603     History Chief Complaint  Patient presents with   Eye Problem    HPI [name] is a 42 y.o. female presenting for chief complaint of eye burning.  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  Initially had burning in her eyes.  She denies fevers chills nausea vomiting syncope shortness of breath.  She otherwise ambulatory tolerating p.o. intake. She is already completed approximately 5 minutes of eyewash up on the floor before coming down. Patient's recorded medical, surgical, social, medication list and allergies were reviewed in the Snapshot window as part of the initial history.   Review of Systems   Review of Systems  Constitutional:  Negative for chills and fever.  HENT:  Negative for ear pain and sore throat.   Eyes:  Positive for pain, itching and visual disturbance.  Respiratory:  Negative for cough and shortness of breath.   Cardiovascular:  Negative for chest pain and palpitations.  Gastrointestinal:  Negative for abdominal pain and vomiting.  Genitourinary:  Negative for dysuria and hematuria.  Musculoskeletal:  Negative for arthralgias and back pain.  Skin:  Negative for color change and rash.  Neurological:  Negative for seizures and syncope.  All other systems reviewed and are negative.   Physical Exam Updated Vital Signs BP (!) 145/92   Pulse 88   Temp 98.3 F (36.8 C) (Oral)   Resp 16   Ht 4\' 11"  (1.499 m)   Wt 88 kg   SpO2 98%   BMI 39.18 kg/m  Physical Exam Vitals and nursing note reviewed.  Constitutional:      General: She is not in acute distress.    Appearance: She is well-developed.  HENT:     Head: Normocephalic and atraumatic.  Eyes:     Conjunctiva/sclera: Conjunctivae normal.  Cardiovascular:     Rate and Rhythm:  Normal rate and regular rhythm.     Heart sounds: No murmur heard. Pulmonary:     Effort: Pulmonary effort is normal. No respiratory distress.     Breath sounds: Normal breath sounds.  Abdominal:     Palpations: Abdomen is soft.     Tenderness: There is no abdominal tenderness.  Musculoskeletal:        General: No swelling.     Cervical back: Neck supple.  Skin:    General: Skin is warm and dry.     Capillary Refill: Capillary refill takes less than 2 seconds.  Neurological:     Mental Status: She is alert.  Psychiatric:        Mood and Affect: Mood normal.      ED Course/ Medical Decision Making/ A&P    Procedures Procedures   Medications Ordered in ED Medications - No data to display  Medical Decision Making:    [name] is a 42 y.o. female who presented to the ED today with chemical splashed in her eye detailed above.    Underwent 15 minutes of high flow irrigation in the emergency room.  pH of eyes 7.5 approximately bilaterally following completion of irrigation.  Blurry vision improved.  She does have some residual irritation of the bilateral conjunctival with visible conjunctivitis bilaterally. Visual fields all intact and vision grossly at her baseline.  No contacts in. Will treat with erythromycin  for infection prevention, saline drops for dry eyes following her exposure and follow-up with her eye doctor within 48 hours as discussed.  Strict return precautions reinforced regarding worsening pain discharge discomfort or visual changes.  Disposition:  I have considered need for hospitalization, however, considering all of the above, I believe this patient is stable for discharge at this time.  Patient/family educated about specific return precautions for given chief complaint and symptoms.  Patient/family educated about follow-up with PCP/OPT.     Patient/family expressed understanding of return precautions and need for follow-up. Patient spoken to regarding  all imaging and laboratory results and appropriate follow up for these results. All education provided in verbal form with additional information in written form. Time was allowed for answering of patient questions. Patient discharged.    Emergency Department Medication Summary:   Medications - No data to display       Clinical Impression:  1. Chemical conjunctivitis of both eyes      Discharge   Final Clinical Impression(s) / ED Diagnoses Final diagnoses:  Chemical conjunctivitis of both eyes    Rx / DC Orders ED Discharge Orders          Ordered    Carboxymethylcellulose Sodium (EYE DROPS) 0.5 % SOLN  4 times daily        06/22/22 1656    erythromycin ophthalmic ointment        06/22/22 1656              Tretha Sciara, MD 06/22/22 1656

## 2022-06-22 NOTE — ED Triage Notes (Addendum)
Patient cleaning a room at work and said IV solutions of saline, potassium, zosyn were hanging and splashed into her eyes. She washed them out at the eye wash station. Stated it burns and her vision is blurry in both eyes.

## 2022-09-20 ENCOUNTER — Encounter (HOSPITAL_COMMUNITY): Payer: Self-pay

## 2022-09-20 ENCOUNTER — Emergency Department (HOSPITAL_COMMUNITY)
Admission: EM | Admit: 2022-09-20 | Discharge: 2022-09-20 | Discharge status: Against Medical Advice | Payer: Medicaid Other | Attending: Emergency Medicine | Admitting: Emergency Medicine

## 2022-09-20 ENCOUNTER — Other Ambulatory Visit: Payer: Self-pay

## 2022-09-20 DIAGNOSIS — Z5321 Procedure and treatment not carried out due to patient leaving prior to being seen by health care provider: Secondary | ICD-10-CM | POA: Insufficient documentation

## 2022-09-20 DIAGNOSIS — G43909 Migraine, unspecified, not intractable, without status migrainosus: Secondary | ICD-10-CM | POA: Diagnosis present

## 2022-09-20 NOTE — ED Triage Notes (Signed)
Pt has been getting daily migraines for 3 weeks. Light and sound sensitivity. Pt feels throbbing in ears. Took tylenol at 2am with no relief.

## 2023-02-26 ENCOUNTER — Other Ambulatory Visit: Payer: Self-pay

## 2023-02-26 ENCOUNTER — Emergency Department (HOSPITAL_COMMUNITY)
Admission: EM | Admit: 2023-02-26 | Discharge: 2023-02-26 | Disposition: A | Discharge status: Home / Self Care | Payer: Medicaid Other | Attending: Emergency Medicine | Admitting: Emergency Medicine

## 2023-02-26 ENCOUNTER — Encounter (HOSPITAL_COMMUNITY): Payer: Self-pay

## 2023-02-26 DIAGNOSIS — Z1152 Encounter for screening for COVID-19: Secondary | ICD-10-CM | POA: Insufficient documentation

## 2023-02-26 DIAGNOSIS — R059 Cough, unspecified: Secondary | ICD-10-CM | POA: Diagnosis present

## 2023-02-26 DIAGNOSIS — J069 Acute upper respiratory infection, unspecified: Secondary | ICD-10-CM | POA: Diagnosis not present

## 2023-02-26 LAB — RESP PANEL BY RT-PCR (RSV, FLU A&B, COVID)  RVPGX2
Influenza A by PCR: NEGATIVE
Influenza B by PCR: NEGATIVE
Resp Syncytial Virus by PCR: NEGATIVE
SARS Coronavirus 2 by RT PCR: NEGATIVE

## 2023-02-26 MED ORDER — ALBUTEROL SULFATE HFA 108 (90 BASE) MCG/ACT IN AERS
2.0000 | INHALATION_SPRAY | RESPIRATORY_TRACT | Status: DC | PRN
  Filled 2023-02-26: qty 6.7

## 2023-02-26 NOTE — ED Provider Notes (Signed)
[place] EMERGENCY DEPARTMENT AT [facility] Provider Note   CSN: [id number] Arrival date & time: 02/26/23  1454     History  Chief Complaint  Patient presents with   Generalized Body Aches    [name] is a 42 y.o. female.  Patient Feeling unwell for the past 2-3 days with symptoms of congestion, cough, body aches. She doesn't want to eat or drink. No vomiting or diarrhea. Reports her sense of smell is decreased.   The history is provided by the patient. No language interpreter was used.       Home Medications Prior to Admission medications   Medication Sig Start Date End Date Taking? Authorizing Provider  albuterol (VENTOLIN HFA) 108 (90 Base) MCG/ACT inhaler Inhale 2 puffs into the lungs every 6  hours as needed for wheezing or shortness of breath. 05/30/21   Georganna Skeans, MD  buPROPion (WELLBUTRIN XL) 150 MG 24 hr tablet Take 1 tablet by mouth daily. 06/30/21   Georganna Skeans, MD  Carboxymethylcellulose Sodium (EYE DROPS) 0.5 % SOLN Apply 2 drops to eye in the morning, at noon, in the evening, and at bedtime. 06/22/22   Glyn Ade, MD  clindamycin (CLEOCIN) 2 % vaginal cream Place 1 Applicatorful vaginally at bedtime. 02/21/22   Small, Brooke L, PA  erythromycin ophthalmic ointment Place a 1/2 inch ribbon of ointment into the lower eyelid. 06/22/22   Glyn Ade, MD  fluconazole (DIFLUCAN) 150 MG tablet Take 1 tablet (150 mg total) by mouth daily. 02/21/22   Small, Brooke L, PA  fluticasone (FLONASE) 50 MCG/ACT nasal spray Place 2 sprays into both nostrils daily. 05/25/21   Cathlyn Parsons, NP  nicotine (NICODERM CQ) 21 mg/24hr patch Place 1 patch (21 mg total) onto the skin daily. 01/27/22   Georganna Skeans, MD  Spacer/Aero-Holding Deretha Emory DEVI 1 each by Does not apply route as needed. 05/25/21   Cathlyn Parsons, NP  Vitamin D, Ergocalciferol, (DRISDOL) 1.25 MG (50000 UNIT) CAPS capsule Take 1 capsule by mouth every 7 days. 07/12/21   Georganna Skeans, MD       Allergies    Benadryl [diphenhydramine hcl]    Review of Systems   Review of Systems  Physical Exam Updated Vital Signs BP 136/74   Pulse 78   Temp 97.8 F (36.6 C) (Oral)   Resp 18   Ht 4\' 11"  (1.499 m)   Wt 90.7 kg   SpO2 98%   BMI 40.40 kg/m  Physical Exam Vitals and nursing note reviewed.  Constitutional:      Appearance: Normal appearance.  HENT:     Head: Normocephalic.     Nose:     Right Sinus: Frontal sinus tenderness present.     Left Sinus: Frontal sinus tenderness present.     Mouth/Throat:     Mouth: Mucous membranes are moist.     Pharynx: No oropharyngeal exudate or posterior oropharyngeal erythema.  Eyes:     Conjunctiva/sclera: Conjunctivae normal.  Neck:     Vascular: No carotid bruit.  Cardiovascular:     Rate and Rhythm: Normal rate and regular rhythm.     Heart sounds: No murmur heard. Pulmonary:     Effort: Pulmonary effort is normal.     Breath sounds: No wheezing, rhonchi or rales.  Abdominal:     Tenderness: There is no abdominal tenderness.  Musculoskeletal:        General: Normal range of motion.     Cervical back: Normal range of  motion and neck supple.  Skin:    General: Skin is warm and dry.  Neurological:     Mental Status: She is alert.     ED Results / Procedures / Treatments   Labs (all labs ordered are listed, but only abnormal results are displayed) Labs Reviewed  RESP PANEL BY RT-PCR (RSV, FLU A&B, COVID)  [id number]    EKG None  Radiology No results found.  Procedures Procedures    Medications Ordered in ED Medications  albuterol (VENTOLIN HFA) 108 (90 Base) MCG/ACT inhaler 2 puff (has no administration in time range)    ED Course/ Medical Decision Making/ A&P Clinical Course as of 02/26/23 1707  Mon Feb 26, 2023  1704 Patient feeling unwell with symptoms of URI without fever. Symptoms x 2-3 days. No vomiting. Well appearing, nontoxic. VSS. Viral panel negative. Presentation c/w viral URI requiring  supportive care.  [SU]    Clinical Course User Index [SU] Elpidio Anis, PA-C                                 Medical Decision Making          Final Clinical Impression(s) / ED Diagnoses Final diagnoses:  Viral upper respiratory tract infection    Rx / DC Orders ED Discharge Orders     None         Elpidio Anis, PA-C 02/26/23 1707    Tegeler, Canary Brim, MD 02/26/23 1722

## 2023-02-26 NOTE — ED Triage Notes (Signed)
Body aches, cough, stuffy nose since Saturday, decreased taste, slight headache.

## 2023-02-26 NOTE — Discharge Instructions (Signed)
Drink lots of fluids and get some bed rest. Take Tylenol and/or ibuprofen as needed for any fever or aches. You can use the albuterol inhaler 2 puffs every 4-6 hours if this helps with cough.   Return to the ED or see your doctor if symptoms worsen.

## 2023-11-26 ENCOUNTER — Emergency Department (HOSPITAL_COMMUNITY)
Admission: EM | Admit: 2023-11-26 | Discharge: 2023-11-26 | Disposition: A | Discharge status: Home / Self Care | Attending: Emergency Medicine | Admitting: Emergency Medicine

## 2023-11-26 ENCOUNTER — Encounter (HOSPITAL_COMMUNITY): Payer: Self-pay

## 2023-11-26 ENCOUNTER — Other Ambulatory Visit (HOSPITAL_BASED_OUTPATIENT_CLINIC_OR_DEPARTMENT_OTHER): Payer: Self-pay

## 2023-11-26 ENCOUNTER — Other Ambulatory Visit: Payer: Self-pay

## 2023-11-26 DIAGNOSIS — E1165 Type 2 diabetes mellitus with hyperglycemia: Secondary | ICD-10-CM | POA: Diagnosis present

## 2023-11-26 DIAGNOSIS — R739 Hyperglycemia, unspecified: Secondary | ICD-10-CM

## 2023-11-26 LAB — CBC WITH DIFFERENTIAL/PLATELET
Abs Immature Granulocytes: 0.01 K/uL (ref 0.00–0.07)
Basophils Absolute: 0 K/uL (ref 0.0–0.1)
Basophils Relative: 0 %
Eosinophils Absolute: 0 K/uL (ref 0.0–0.5)
Eosinophils Relative: 1 %
HCT: 39.4 % (ref 36.0–46.0)
Hemoglobin: 13.1 g/dL (ref 12.0–15.0)
Immature Granulocytes: 0 %
Lymphocytes Relative: 30 %
Lymphs Abs: 2 K/uL (ref 0.7–4.0)
MCH: 32 pg (ref 26.0–34.0)
MCHC: 33.2 g/dL (ref 30.0–36.0)
MCV: 96.3 fL (ref 80.0–100.0)
Monocytes Absolute: 0.5 K/uL (ref 0.1–1.0)
Monocytes Relative: 7 %
Neutro Abs: 4.1 K/uL (ref 1.7–7.7)
Neutrophils Relative %: 62 %
Platelets: 314 K/uL (ref 150–400)
RBC: 4.09 MIL/uL (ref 3.87–5.11)
RDW: 11.9 % (ref 11.5–15.5)
WBC: 6.7 K/uL (ref 4.0–10.5)
nRBC: 0 % (ref 0.0–0.2)

## 2023-11-26 LAB — URINALYSIS, W/ REFLEX TO CULTURE (INFECTION SUSPECTED)
Bilirubin Urine: NEGATIVE
Glucose, UA: >=500 mg/dL — AB
Ketones, ur: NEGATIVE mg/dL
Leukocytes,Ua: NEGATIVE
Nitrite: NEGATIVE
Protein, ur: NEGATIVE mg/dL
Specific Gravity, Urine: 1.027 (ref 1.005–1.030)
pH: 6 (ref 5.0–8.0)

## 2023-11-26 LAB — COMPREHENSIVE METABOLIC PANEL WITH GFR
ALT: 14 U/L (ref 0–44)
AST: 19 U/L (ref 15–41)
Albumin: 4.3 g/dL (ref 3.5–5.0)
Alkaline Phosphatase: 86 U/L (ref 38–126)
Anion gap: 13 (ref 5–15)
BUN: 10 mg/dL (ref 6–20)
CO2: 21 mmol/L — ABNORMAL LOW (ref 22–32)
Calcium: 9.6 mg/dL (ref 8.9–10.3)
Chloride: 97 mmol/L — ABNORMAL LOW (ref 98–111)
Creatinine, Ser: 0.95 mg/dL (ref 0.44–1.00)
GFR, Estimated: >60 mL/min (ref >60)
Glucose, Bld: 568 mg/dL (ref 70–99)
Potassium: 4.6 mmol/L (ref 3.5–5.1)
Sodium: 131 mmol/L — ABNORMAL LOW (ref 135–145)
Total Bilirubin: 0.3 mg/dL (ref 0.0–1.2)
Total Protein: 7.3 g/dL (ref 6.5–8.1)

## 2023-11-26 LAB — BETA-HYDROXYBUTYRIC ACID: Beta-Hydroxybutyric Acid: 0.1 mmol/L (ref 0.05–0.27)

## 2023-11-26 LAB — CBG MONITORING, ED
Glucose-Capillary: 549 mg/dL (ref 70–99)
Glucose-Capillary: 383 mg/dL — ABNORMAL HIGH (ref 70–99)

## 2023-11-26 LAB — HCG, SERUM, QUALITATIVE: Preg, Serum: NEGATIVE

## 2023-11-26 MED ORDER — INSULIN ASPART 100 UNIT/ML IJ SOLN
8.0000 [IU] | Freq: Once | INTRAMUSCULAR | Status: AC
  Administered 2023-11-26: 8 [IU] via INTRAVENOUS
  Filled 2023-11-26: qty 0.08

## 2023-11-26 MED ORDER — SODIUM CHLORIDE 0.9 % IV BOLUS
1000.0000 mL | Freq: Once | INTRAVENOUS | Status: AC
  Administered 2023-11-26: 1000 mL via INTRAVENOUS

## 2023-11-26 MED ORDER — METFORMIN HCL 500 MG PO TABS
500.0000 mg | ORAL_TABLET | Freq: Two times a day (BID) | ORAL | 2 refills | Status: DC
  Filled 2023-11-26: qty 60, 30d supply, fill #0

## 2023-11-26 NOTE — Discharge Instructions (Addendum)
 Return for any problem.   Follow up with your regular outpatient provider as instructed.  Start taking metformin  as prescribed.

## 2023-11-26 NOTE — ED Triage Notes (Signed)
 Pt presents to ED from work C/O blurred vision, urinary frequency, increased urination.   CBG 549 in triage.

## 2023-11-26 NOTE — ED Provider Notes (Signed)
 [name] EMERGENCY DEPARTMENT AT [facility] Provider Note   CSN: [id number] Arrival date & time: 11/26/23  1432     Patient presents with: Hyperglycemia   [name] is a 43 y.o. female.   43 year old female with prior medical history as detailed who presents for evaluation.  Patient reports several weeks of increased thirst, increased urination, intermittent blurry vision.  She denies prior history of diabetes.  She reports that diabetes runs in my family.  She denies current outpatient primary care.  She reports that she has let her medical insurance lapse because it was too much out of my paycheck.  Patient with Accu-Chek of 549 in triage.  She denies recent fever, chest pain, cough, shortness of breath, nausea, vomiting, other complaint.  The history is provided by the patient and medical records.       Prior to Admission medications   Medication Sig Start Date End Date Taking? Authorizing Provider  albuterol  (VENTOLIN  HFA) 108 (90 Base) MCG/ACT inhaler Inhale 2 puffs into the lungs every 6  hours as needed for wheezing or shortness of breath. 05/30/21   Tanda Bleacher, MD  buPROPion  (WELLBUTRIN  XL) 150 MG 24 hr tablet Take 1 tablet by mouth daily. 06/30/21   Tanda Bleacher, MD  Carboxymethylcellulose Sodium (EYE DROPS) 0.5 % SOLN Apply 2 drops to eye in the morning, at noon, in the evening, and at bedtime. 06/22/22   Jerral Meth, MD  clindamycin  (CLEOCIN ) 2 % vaginal cream Place 1 Applicatorful vaginally at bedtime. 02/21/22   Small, Brooke L, PA  erythromycin  ophthalmic ointment Place a 1/2 inch ribbon of ointment into the lower eyelid. 06/22/22   Jerral Meth, MD  fluconazole  (DIFLUCAN ) 150 MG tablet Take 1 tablet (150 mg total) by mouth daily. 02/21/22   Small, Brooke L, PA  fluticasone  (FLONASE ) 50 MCG/ACT nasal spray Place 2 sprays into both nostrils daily. 05/25/21   Richad Jon HERO, NP  nicotine  (NICODERM CQ ) 21 mg/24hr patch Place 1 patch (21  mg total) onto the skin daily. 01/27/22   Tanda Bleacher, MD  Spacer/Aero-Holding Raguel DEVI 1 each by Does not apply route as needed. 05/25/21   Richad Jon HERO, NP  Vitamin D , Ergocalciferol , (DRISDOL ) 1.25 MG (50000 UNIT) CAPS capsule Take 1 capsule by mouth every 7 days. 07/12/21   Tanda Bleacher, MD    Allergies: Benadryl [diphenhydramine hcl]    Review of Systems  All other systems reviewed and are negative.   Updated Vital Signs BP (!) 142/77   Pulse 99   Temp 98.4 F (36.9 C) (Oral)   Resp (!) 22   Ht 4' 11 (1.499 m)   Wt 90.7 kg   SpO2 99%   BMI 40.40 kg/m   Physical Exam Vitals and nursing note reviewed.  Constitutional:      General: She is not in acute distress.    Appearance: Normal appearance. She is well-developed.  HENT:     Head: Normocephalic and atraumatic.  Eyes:     Conjunctiva/sclera: Conjunctivae normal.     Pupils: Pupils are equal, round, and reactive to light.  Cardiovascular:     Rate and Rhythm: Normal rate and regular rhythm.     Heart sounds: Normal heart sounds.  Pulmonary:     Effort: Pulmonary effort is normal. No respiratory distress.     Breath sounds: Normal breath sounds.  Abdominal:     General: There is no distension.     Palpations: Abdomen is soft.  Tenderness: There is no abdominal tenderness.  Musculoskeletal:        General: No deformity. Normal range of motion.     Cervical back: Normal range of motion and neck supple.  Skin:    General: Skin is warm and dry.  Neurological:     General: No focal deficit present.     Mental Status: She is alert and oriented to person, place, and time.     (all labs ordered are listed, but only abnormal results are displayed) Labs Reviewed  CBG MONITORING, ED - Abnormal; Notable for the following components:      Result Value   Glucose-Capillary 549 (*)    All other components within normal limits  URINALYSIS, W/ REFLEX TO CULTURE (INFECTION SUSPECTED)  CBC WITH  DIFFERENTIAL/PLATELET  COMPREHENSIVE METABOLIC PANEL WITH GFR  HCG, SERUM, QUALITATIVE  BETA-HYDROXYBUTYRIC ACID    EKG: None  Radiology: No results found.   Procedures   Medications Ordered in the ED  sodium chloride  0.9 % bolus 1,000 mL (has no administration in time range)                                    Medical Decision Making Patient without prior known history of diabetes.  She reports with symptoms consistent with diabetes.  Hyperglycemia identified on workup.  This was improved with administration of IV fluids and insulin .  Patient without evidence of significant metabolic derangement related to her hyperglycemia.  Patient offered admission.  She declines.  She will prefer to go home.  She is advised to start taking her metformin .  She is advised that she needs further close follow-up with PCP for further treatment of her newly diagnosed diabetes.  Importance of close follow-up is stressed.  Strict return precautions given and understood.  Amount and/or Complexity of Data Reviewed Labs: ordered.  Risk Prescription drug management.        Final diagnoses:  Hyperglycemia    ED Discharge Orders     None          Laurice Maude BROCKS, MD 11/26/23 [phone]

## 2023-11-27 ENCOUNTER — Other Ambulatory Visit (HOSPITAL_COMMUNITY): Payer: Self-pay

## 2023-11-27 NOTE — Telephone Encounter (Signed)
 First attempt: LVM for patient to return call to [phone]   Copied from CRM #[id number]. Topic: Clinical - Medication Question >> Nov 27, 2023  8:43 AM [name] wrote: Reason for CRM: The patient called stating she was seen in the ED at [facility] for dizziness and blurred vision on 11/26/23. Her sugar when she got there was 549 and she was given 2 bags of IV fluid as she was dehydrated and also a Novolog  injection. She is going to hopefully pick up her Metformin  today but she is self pay and doesn't know if she can afford it. She also wants to know if it would be ok for her to use her parents continuous glucose monitor to check her sugar until she is seen for her hospital follow up on September 10th? Please assist patient further. >> Nov 27, 2023  9:17 AM [name] wrote: The patient called back asking if she took the montior to the providers office could they tell her how to use it and prescribed needles and whatever else she needed. I told her unfortunately she would have to be seen first but that a nurse will talk to her more about it when she/he calls later today and she said ok and thanks.

## 2023-11-27 NOTE — Telephone Encounter (Signed)
 Noted thanks

## 2023-11-27 NOTE — Telephone Encounter (Signed)
 Copied from CRM #[id number]. Topic: Clinical - Medication Question >> Nov 27, 2023  8:43 AM Selinda RAMAN wrote: Reason for CRM: The patient called stating she was seen in the ED at [facility] for dizziness and blurred vision on 11/26/23. Her sugar when she got there was 549 and she was given 2 bags of IV fluid as she was dehydrated and also a Novolog  injection. She is going to hopefully pick up her Metformin  today but she is self pay and doesn't know if she can afford it. She also wants to know if it would be ok for her to use her parents continuous glucose monitor to check her sugar until she is seen for her hospital follow up on September 10th? Please assist patient further. This encounter was created in error - please disregard.

## 2023-11-27 NOTE — Telephone Encounter (Signed)
 Patient going to the [facility] today to be seen---patient wants to check her blood sugar and possibly get the information/prescriptions for glucose monitoring equipment. Patient also wanted further education on diabetes   FYI Only or Action Required?: FYI only for provider.  Patient was last seen in primary care on 01/27/2022 by Tanda Bleacher, MD.  Called Nurse Triage reporting Information Only.  Symptoms began n/a.  Interventions attempted: Rest, hydration, or home remedies.  Symptoms are: unchanged.  Triage Disposition: Call PCP When Office is Open  Patient/caregiver understands and will follow disposition?: Yes               Reason for Disposition  [1] Caller requesting NON-URGENT health information AND [2] PCP's office is the best resource  Answer Assessment - Initial Assessment Questions Patient left the hospital last night and her blood sugar was 383 Patient states that she just feels drained and tired now and has wanted to lay around most of the day  Patient states that she hasn't eaten today  Patient states that her sister has a glucose monitor that she hasn't used if the patient wants it---would need a prescription for test strips and lancets ---One Touch Veroflex  Patient hasn't picked up her Metformin  yet Patient states she feels drained  Patient states that she doesn't have any information about diabetes or what she should eat or drink She doesn't know what her blood sugar is at this time She has an appointment made for next week already as a hospital follow up but this RN feels that it would be safer for her to be seen This RN called [facility] for further assistance with scheduling patient Patient is scheduled with [facility] for an earlier hospital follow up appointment   Patient states that she is going to go to the [facility] today.  Patient is given the address to the [facility] and she states she will go get checked out today  there since she has no way to check her blood sugar at this time. She is also advised that if anything gets worse to go to the Emergency Room Patient verbalized understanding  Protocols used: Information Only Call - No Triage-A-AH

## 2023-11-28 ENCOUNTER — Other Ambulatory Visit: Payer: Self-pay

## 2023-11-28 ENCOUNTER — Encounter (HOSPITAL_COMMUNITY): Payer: Self-pay | Admitting: *Deleted

## 2023-11-28 ENCOUNTER — Emergency Department (HOSPITAL_COMMUNITY)
Admission: EM | Admit: 2023-11-28 | Discharge: 2023-11-29 | Discharge status: Against Medical Advice | Attending: Emergency Medicine | Admitting: Emergency Medicine

## 2023-11-28 DIAGNOSIS — R531 Weakness: Secondary | ICD-10-CM | POA: Diagnosis not present

## 2023-11-28 DIAGNOSIS — Z5321 Procedure and treatment not carried out due to patient leaving prior to being seen by health care provider: Secondary | ICD-10-CM | POA: Diagnosis not present

## 2023-11-28 DIAGNOSIS — R739 Hyperglycemia, unspecified: Secondary | ICD-10-CM | POA: Diagnosis present

## 2023-11-28 DIAGNOSIS — Z7984 Long term (current) use of oral hypoglycemic drugs: Secondary | ICD-10-CM | POA: Diagnosis not present

## 2023-11-28 DIAGNOSIS — E1165 Type 2 diabetes mellitus with hyperglycemia: Secondary | ICD-10-CM | POA: Insufficient documentation

## 2023-11-28 LAB — CBG MONITORING, ED: Glucose-Capillary: 299 mg/dL — ABNORMAL HIGH (ref 70–99)

## 2023-11-28 NOTE — ED Triage Notes (Signed)
 The pt has not been feeling well  and today she has had the jitters

## 2023-11-28 NOTE — ED Triage Notes (Signed)
 The pt wa diagnosed as a diabetic this past Monday  she was given metformin  to take  since Monday her glucose had not been below 250 each day  lmp last month 20th

## 2023-11-29 ENCOUNTER — Encounter (HOSPITAL_BASED_OUTPATIENT_CLINIC_OR_DEPARTMENT_OTHER): Payer: Self-pay | Admitting: Emergency Medicine

## 2023-11-29 ENCOUNTER — Emergency Department (HOSPITAL_BASED_OUTPATIENT_CLINIC_OR_DEPARTMENT_OTHER)
Admission: EM | Admit: 2023-11-29 | Discharge: 2023-11-29 | Disposition: A | Discharge status: Home / Self Care | Source: Home / Self Care | Attending: Emergency Medicine | Admitting: Emergency Medicine

## 2023-11-29 DIAGNOSIS — E1165 Type 2 diabetes mellitus with hyperglycemia: Secondary | ICD-10-CM | POA: Insufficient documentation

## 2023-11-29 DIAGNOSIS — R739 Hyperglycemia, unspecified: Secondary | ICD-10-CM

## 2023-11-29 DIAGNOSIS — Z7984 Long term (current) use of oral hypoglycemic drugs: Secondary | ICD-10-CM | POA: Insufficient documentation

## 2023-11-29 LAB — COMPREHENSIVE METABOLIC PANEL WITH GFR
ALT: 20 U/L (ref 0–44)
AST: 19 U/L (ref 15–41)
Albumin: 3.6 g/dL (ref 3.5–5.0)
Alkaline Phosphatase: 65 U/L (ref 38–126)
Anion gap: 10 (ref 5–15)
BUN: 13 mg/dL (ref 6–20)
CO2: 23 mmol/L (ref 22–32)
Calcium: 9.3 mg/dL (ref 8.9–10.3)
Chloride: 100 mmol/L (ref 98–111)
Creatinine, Ser: 0.88 mg/dL (ref 0.44–1.00)
GFR, Estimated: >60 mL/min (ref >60)
Glucose, Bld: 291 mg/dL — ABNORMAL HIGH (ref 70–99)
Potassium: 4.5 mmol/L (ref 3.5–5.1)
Sodium: 133 mmol/L — ABNORMAL LOW (ref 135–145)
Total Bilirubin: 0.5 mg/dL (ref 0.0–1.2)
Total Protein: 7.2 g/dL (ref 6.5–8.1)

## 2023-11-29 LAB — LIPASE, BLOOD: Lipase: 30 U/L (ref 11–51)

## 2023-11-29 LAB — URINALYSIS, ROUTINE W REFLEX MICROSCOPIC
Bacteria, UA: NONE SEEN
Bilirubin Urine: NEGATIVE
Glucose, UA: >=500 mg/dL — AB
Ketones, ur: 5 mg/dL — AB
Leukocytes,Ua: NEGATIVE
Nitrite: NEGATIVE
Protein, ur: NEGATIVE mg/dL
Specific Gravity, Urine: 1.007 (ref 1.005–1.030)
pH: 5 (ref 5.0–8.0)

## 2023-11-29 LAB — CBC
HCT: 39.7 % (ref 36.0–46.0)
Hemoglobin: 13.1 g/dL (ref 12.0–15.0)
MCH: 31.7 pg (ref 26.0–34.0)
MCHC: 33 g/dL (ref 30.0–36.0)
MCV: 96.1 fL (ref 80.0–100.0)
Platelets: 333 K/uL (ref 150–400)
RBC: 4.13 MIL/uL (ref 3.87–5.11)
RDW: 11.9 % (ref 11.5–15.5)
WBC: 7.3 K/uL (ref 4.0–10.5)
nRBC: 0.5 % — ABNORMAL HIGH (ref 0.0–0.2)

## 2023-11-29 LAB — I-STAT CHEM 8, ED
BUN: 13 mg/dL (ref 6–20)
Calcium, Ion: 1.18 mmol/L (ref 1.15–1.40)
Chloride: 101 mmol/L (ref 98–111)
Creatinine, Ser: 0.8 mg/dL (ref 0.44–1.00)
Glucose, Bld: 285 mg/dL — ABNORMAL HIGH (ref 70–99)
HCT: 40 % (ref 36.0–46.0)
Hemoglobin: 13.6 g/dL (ref 12.0–15.0)
Potassium: 4.3 mmol/L (ref 3.5–5.1)
Sodium: 133 mmol/L — ABNORMAL LOW (ref 135–145)
TCO2: 20 mmol/L — ABNORMAL LOW (ref 22–32)

## 2023-11-29 LAB — CBG MONITORING, ED
Glucose-Capillary: 263 mg/dL — ABNORMAL HIGH (ref 70–99)
Glucose-Capillary: 345 mg/dL — ABNORMAL HIGH (ref 70–99)

## 2023-11-29 LAB — HCG, SERUM, QUALITATIVE: Preg, Serum: NEGATIVE

## 2023-11-29 MED ORDER — METFORMIN HCL 500 MG PO TABS
500.0000 mg | ORAL_TABLET | Freq: Once | ORAL | Status: AC
  Administered 2023-11-29: 500 mg via ORAL
  Filled 2023-11-29: qty 1

## 2023-11-29 NOTE — ED Triage Notes (Signed)
  Patient comes in with hyperglycemia.  Patient states she was recently diagnosed with type 2 diabetes and put on metformin .  Was seen on 9/1 and had cbg of >500.  Was at [facility] earlier and had labs/urine done.  Patient left after waiting several hours.  Denies any pain but states she doesn't feel well.  Patient states her stomach is empty but she doesn't feel the urge to eat.

## 2023-11-29 NOTE — ED Notes (Incomplete)
 Discharge instructions and follow up care reviewed and explained, pt verbalized understanding. Diabetes diet resources providfed

## 2023-11-29 NOTE — ED Notes (Signed)
 Pt states that she is leaving and going to another facility. Pt encouraged to stay but adamant that she wishes to leave

## 2023-11-29 NOTE — ED Provider Notes (Signed)
 [place] EMERGENCY DEPARTMENT AT [facility] Provider Note  CSN: [id number] Arrival date & time: 11/29/23 0146  Chief Complaint(s) Hyperglycemia  HPI [name] is a 43 y.o. female recently diagnosed with diabetes started on metformin  here for elevated blood sugar levels and feeling unwell.  Patient reports that she has been keeping track of her blood sugar levels.  She is on day 2 of full dose metformin .  She has been checking her blood sugar levels and they have not gone below 250.  She has made some dietary changes.  No nausea or vomiting.  She is endorsing loose stools since starting metformin .   Patient initially went to [facility] but left due to prolonged wait times and came here  Hyperglycemia   Past Medical History History reviewed. No pertinent past medical history. There are no active problems to display for this patient.  Home Medication(s) Prior to Admission medications   Medication Sig Start Date End Date Taking? Authorizing Provider  albuterol  (VENTOLIN  HFA) 108 (90 Base) MCG/ACT inhaler Inhale 2 puffs into the lungs every 6  hours as needed for wheezing or shortness of breath. Patient not taking: Reported on 11/26/2023 05/30/21   Tanda Bleacher, MD  buPROPion  (WELLBUTRIN  XL) 150 MG 24 hr tablet Take 1 tablet by mouth daily. Patient not taking: Reported on 11/26/2023 06/30/21   Tanda Bleacher, MD  Carboxymethylcellulose Sodium (EYE DROPS) 0.5 % SOLN Apply 2 drops to eye in the morning, at noon, in the evening, and at bedtime. Patient not taking: Reported on 11/26/2023 06/22/22   Jerral Meth, MD  clindamycin  (CLEOCIN ) 2 % vaginal cream Place 1 Applicatorful vaginally at bedtime. Patient not taking: Reported on 11/26/2023 02/21/22   Small, Brooke L, PA  erythromycin  ophthalmic ointment Place a 1/2 inch ribbon of ointment into the lower eyelid. Patient not taking: Reported on 11/26/2023 06/22/22   Jerral Meth, MD  fluconazole  (DIFLUCAN ) 150 MG tablet Take 1  tablet (150 mg total) by mouth daily. Patient not taking: Reported on 11/26/2023 02/21/22   Small, Brooke L, PA  fluticasone  (FLONASE ) 50 MCG/ACT nasal spray Place 2 sprays into both nostrils daily. Patient not taking: Reported on 11/26/2023 05/25/21   Richad Jon HERO, NP  metFORMIN  (GLUCOPHAGE ) 500 MG tablet Take 1 tablet (500 mg total) by mouth 2 (two) times daily with a meal. 11/26/23   Laurice Maude BROCKS, MD  Nicotine  (NICODERM CQ  TD) Place 1 patch onto the skin daily as needed (for smoking cessation while hospitalized).    [provider]  nicotine  (NICODERM CQ ) 21 mg/24hr patch Place 1 patch (21 mg total) onto the skin daily. Patient not taking: Reported on 11/26/2023 01/27/22   Tanda Bleacher, MD  Spacer/Aero-Holding Raguel DEVI 1 each by Does not apply route as needed. Patient not taking: Reported on 11/26/2023 05/25/21   Richad Jon HERO, NP  Vitamin D , Ergocalciferol , (DRISDOL ) 1.25 MG (50000 UNIT) CAPS capsule Take 1 capsule by mouth every 7 days. Patient not taking: Reported on 11/26/2023 07/12/21   Tanda Bleacher, MD  Allergies Benadryl [diphenhydramine hcl]  Review of Systems Review of Systems As noted in HPI  Physical Exam Vital Signs  I have reviewed the triage vital signs BP 137/69   Pulse 73   Temp 98 F (36.7 C) (Oral)   Resp 16   Ht 4' 11 (1.499 m)   Wt 90.3 kg   LMP 11/14/2023   SpO2 99%   BMI 40.19 kg/m   Physical Exam Vitals reviewed.  Constitutional:      General: She is not in acute distress.    Appearance: She is well-developed. She is not diaphoretic.  HENT:     Head: Normocephalic and atraumatic.     Right Ear: External ear normal.     Left Ear: External ear normal.     Nose: Nose normal.  Eyes:     General: No scleral icterus.    Conjunctiva/sclera: Conjunctivae normal.  Neck:     Trachea: Phonation normal.   Cardiovascular:     Rate and Rhythm: Normal rate and regular rhythm.  Pulmonary:     Effort: Pulmonary effort is normal. No respiratory distress.     Breath sounds: No stridor.  Abdominal:     General: There is no distension.  Musculoskeletal:        General: Normal range of motion.     Cervical back: Normal range of motion.  Neurological:     Mental Status: She is alert and oriented to person, place, and time.  Psychiatric:        Behavior: Behavior normal.     ED Results and Treatments Labs (all labs ordered are listed, but only abnormal results are displayed) Labs Reviewed  CBG MONITORING, ED - Abnormal; Notable for the following components:      Result Value   Glucose-Capillary 263 (*)    All other components within normal limits  CBG MONITORING, ED - Abnormal; Notable for the following components:   Glucose-Capillary 345 (*)    All other components within normal limits                                                                                                                         EKG  EKG Interpretation Date/Time:    Ventricular Rate:    PR Interval:    QRS Duration:    QT Interval:    QTC Calculation:   R Axis:      Text Interpretation:         Radiology No results found.  Medications Ordered in ED Medications  metFORMIN  (GLUCOPHAGE ) tablet 500 mg (500 mg Oral Given 11/29/23 0729)   Procedures Procedures  (including critical care time) Medical Decision Making / ED Course   Medical Decision Making Amount and/or Complexity of Data Reviewed Labs: ordered. Decision-making details documented in ED Course.  Risk Prescription drug management.    Hyperglycemia.  Labs at [facility] were reassuring and not concerning for DKA or HHS.  Blood sugar levels are less than 350 here.  She was given her morning dose of metformin .  We had a long discussion regarding additional dietary and lifestyle changes.  She is scheduled to follow-up with her primary care  provider in the next week.     Final Clinical Impression(s) / ED Diagnoses Final diagnoses:  Hyperglycemia   The patient appears reasonably screened and/or stabilized for discharge and I doubt any other medical condition or other [facility] requiring further screening, evaluation, or treatment in the ED at this time. I have discussed the findings, Dx and Tx plan with the patient/family who expressed understanding and agree(s) with the plan. Discharge instructions discussed at length. The patient/family was given strict return precautions who verbalized understanding of the instructions. No further questions at time of discharge.  Disposition: Discharge  Condition: Good  ED Discharge Orders     None         Follow Up: Tanda Bleacher, MD [address] [place] [address] [phone]  Go to  as scheduled    This chart was dictated using voice recognition software.  Despite best efforts to proofread,  errors can occur which can change the documentation meaning.    Trine Raynell Moder, MD 11/29/23 [phone]

## 2023-11-30 ENCOUNTER — Telehealth: Payer: Self-pay | Admitting: Family Medicine

## 2023-11-30 ENCOUNTER — Ambulatory Visit: Payer: Self-pay | Admitting: Family

## 2023-11-30 ENCOUNTER — Other Ambulatory Visit (HOSPITAL_COMMUNITY): Payer: Self-pay

## 2023-11-30 ENCOUNTER — Encounter: Payer: Self-pay | Admitting: Family

## 2023-11-30 VITALS — BP 117/80 | HR 82 | Temp 98.5°F | Resp 18 | Ht 59.0 in | Wt 189.4 lb

## 2023-11-30 DIAGNOSIS — E1165 Type 2 diabetes mellitus with hyperglycemia: Secondary | ICD-10-CM | POA: Diagnosis not present

## 2023-11-30 DIAGNOSIS — E119 Type 2 diabetes mellitus without complications: Secondary | ICD-10-CM

## 2023-11-30 DIAGNOSIS — H538 Other visual disturbances: Secondary | ICD-10-CM

## 2023-11-30 DIAGNOSIS — F32A Depression, unspecified: Secondary | ICD-10-CM

## 2023-11-30 DIAGNOSIS — F419 Anxiety disorder, unspecified: Secondary | ICD-10-CM | POA: Diagnosis not present

## 2023-11-30 DIAGNOSIS — Z7984 Long term (current) use of oral hypoglycemic drugs: Secondary | ICD-10-CM

## 2023-11-30 DIAGNOSIS — F17210 Nicotine dependence, cigarettes, uncomplicated: Secondary | ICD-10-CM | POA: Diagnosis not present

## 2023-11-30 DIAGNOSIS — Z716 Tobacco abuse counseling: Secondary | ICD-10-CM

## 2023-11-30 DIAGNOSIS — Z23 Encounter for immunization: Secondary | ICD-10-CM

## 2023-11-30 LAB — POCT GLYCOSYLATED HEMOGLOBIN (HGB A1C): Hemoglobin A1C: 9.7 % — AB (ref 4.0–5.6)

## 2023-11-30 MED ORDER — BUPROPION HCL ER (XL) 150 MG PO TB24
150.0000 mg | ORAL_TABLET | Freq: Every day | ORAL | 0 refills | Status: AC
  Filled 2023-11-30: qty 90, 90d supply, fill #0

## 2023-11-30 MED ORDER — TRUE METRIX BLOOD GLUCOSE TEST VI STRP
1.0000 | ORAL_STRIP | 12 refills | Status: DC | PRN
  Filled 2023-11-30: qty 100, 100d supply, fill #0

## 2023-11-30 MED ORDER — NICOTINE 7 MG/24HR TD PT24
7.0000 mg | MEDICATED_PATCH | Freq: Every day | TRANSDERMAL | 2 refills | Status: DC
  Filled 2023-11-30: qty 28, 28d supply, fill #0

## 2023-11-30 MED ORDER — TRUEPLUS LANCETS 28G MISC
1.0000 | 11 refills | Status: DC | PRN
  Filled 2023-11-30: qty 100, 100d supply, fill #0

## 2023-11-30 MED ORDER — METFORMIN HCL 500 MG PO TABS
500.0000 mg | ORAL_TABLET | Freq: Two times a day (BID) | ORAL | 2 refills | Status: DC
  Filled 2023-11-30 – 2023-12-23 (×2): qty 60, 30d supply, fill #0
  Filled 2024-01-24: qty 60, 30d supply, fill #1

## 2023-11-30 NOTE — Progress Notes (Signed)
 Patient just got diagnosis with being a diabetes, patient does not feel well, body aching, blur vision, feel fatigue,patient wants nicotine  patches, patient scored a 10 on GAD-7

## 2023-11-30 NOTE — Telephone Encounter (Signed)
 Copied from CRM [id number]. Topic: Clinical - Medication Question >> Nov 30, 2023  1:15 PM Charlet HERO wrote: Reason for CRM: [name] [phone] is calling about strips and lancets just says use as directed but insurance requires a lancing schedule have to have how many times a day. >> Nov 30, 2023  4:38 PM Travis F wrote: [name] is calling in requesting clarification for a prescription. A message was sent earlier but they haven't heard back. Pharmacy says patient will be out of the prescription this weekend if this is clarified today.

## 2023-11-30 NOTE — Progress Notes (Signed)
 Patient ID: [name], female    DOB: 09/19/1980  MRN: [id number]  CC: Emergency Department Follow-Up  Subjective: [name] is a 42 y.o. female who presents for Emergency Department Follow-Up.  Her concerns today include:  - Patient seen on 11/26/2023 (5 hours) at [facility] Emergency Department at [facility] for hyperglycemia. Patient also seen on 11/29/2023 (1 hours) at [facility] Emergency Department at [facility] for hyperglycemia. Today patient states she feels that Metformin  is making her feel tired/body aches but is tolerable for now. States home blood sugars are 200's - 300's. She is recently trying to watch what she eats. States bilateral blurry vision, denies red flag symptoms. She is due for diabetic foot exam. States she would like referral to Endocrinology and nutritionist.  - Anxiety depression. States she was doing well on Wellbutrin  XL and quit taking because she didn't want to rely on it. She denies thoughts of self-harm, suicidal ideations, homicidal ideations. - Smoking 1.5 PPD. Would like to try nicotine  patch.  There are no active problems to display for this patient.    Current Outpatient Medications on File Prior to Visit  Medication Sig Dispense Refill   albuterol  (VENTOLIN  HFA) 108 (90 Base) MCG/ACT inhaler Inhale 2 puffs into the lungs every 6  hours as needed for wheezing or shortness of breath. (Patient not taking: Reported on 11/26/2023) 8.5 g 0   Carboxymethylcellulose Sodium (EYE DROPS) 0.5 % SOLN Apply 2 drops to eye in the morning, at noon, in the evening, and at bedtime. (Patient not taking: Reported on 11/26/2023) 15 mL 0   clindamycin  (CLEOCIN ) 2 % vaginal cream Place 1 Applicatorful vaginally at bedtime. (Patient not taking: Reported on 11/26/2023) 40 g 0   erythromycin  ophthalmic ointment Place a 1/2 inch ribbon of ointment into the lower eyelid. (Patient not taking: Reported on 11/26/2023) 3.5 g 0   fluconazole  (DIFLUCAN ) 150 MG  tablet Take 1 tablet (150 mg total) by mouth daily. (Patient not taking: Reported on 11/26/2023) 2 tablet 0   fluticasone  (FLONASE ) 50 MCG/ACT nasal spray Place 2 sprays into both nostrils daily. (Patient not taking: Reported on 11/26/2023) 16 g 0   Nicotine  (NICODERM CQ  TD) Place 1 patch onto the skin daily as needed (for smoking cessation while hospitalized). (Patient not taking: Reported on 11/30/2023)     nicotine  (NICODERM CQ ) 21 mg/24hr patch Place 1 patch (21 mg total) onto the skin daily. (Patient not taking: Reported on 11/26/2023) 28 patch 1   Spacer/Aero-Holding Chambers DEVI 1 each by Does not apply route as needed. (Patient not taking: Reported on 11/26/2023) 1 each 0   Vitamin D , Ergocalciferol , (DRISDOL ) 1.25 MG (50000 UNIT) CAPS capsule Take 1 capsule by mouth every 7 days. (Patient not taking: Reported on 11/26/2023) 12 capsule 0   No current facility-administered medications on file prior to visit.    Allergies  Allergen Reactions   Benadryl [Diphenhydramine Hcl] Hives, Shortness Of Breath, Swelling and Other (See Comments)    Swelling all over    Social History   Socioeconomic History   Marital status: Single    Spouse name: Not on file   Number of children: Not on file   Years of education: Not on file   Highest education level: Not on file  Occupational History   Not on file  Tobacco Use   Smoking status: Every Day    Current packs/day: 1.50    Types: Cigarettes   Smokeless tobacco: Never  Substance  and Sexual Activity   Alcohol use: Yes    Comment: socially   Drug use: No   Sexual activity: Yes    Birth control/protection: None  Other Topics Concern   Not on file  Social History Narrative   Not on file   Social Drivers of Health   Financial Resource Strain: Medium Risk (11/30/2023)   Overall Financial Resource Strain (CARDIA)    Difficulty of Paying Living Expenses: Somewhat hard  Food Insecurity: Food Insecurity Present (11/30/2023)   Hunger Vital Sign     Worried About Running Out of Food in the Last Year: Sometimes true    Ran Out of Food in the Last Year: Sometimes true  Transportation Needs: No Transportation Needs (11/30/2023)   PRAPARE - Administrator, Civil Service (Medical): No    Lack of Transportation (Non-Medical): No  Physical Activity: Sufficiently Active (11/30/2023)   Exercise Vital Sign    Days of Exercise per Week: 5 days    Minutes of Exercise per Session: 30 min  Stress: No Stress Concern Present (11/30/2023)   [employer] of Occupational Health - Occupational Stress Questionnaire    Feeling of Stress: Only a little  Social Connections: Moderately Isolated (11/30/2023)   Social Connection and Isolation Panel    Frequency of Communication with Friends and Family: More than three times a week    Frequency of Social Gatherings with Friends and Family: Three times a week    Attends Religious Services: 1 to 4 times per year    Active Member of Clubs or Organizations: No    Attends Banker Meetings: Never    Marital Status: Divorced  Catering manager Violence: Not At Risk (11/30/2023)   Humiliation, Afraid, Rape, and Kick questionnaire    Fear of Current or Ex-Partner: No    Emotionally Abused: No    Physically Abused: No    Sexually Abused: No    Family History  Problem Relation Age of Onset   Diabetes Mother    Diabetes Father    Heart Problems Father    Hypertension Father     Past Surgical History:  Procedure Laterality Date   CESAREAN SECTION      ROS: Review of Systems Negative except as stated above  PHYSICAL EXAM: BP 117/80   Pulse 82   Temp 98.5 F (36.9 C) (Oral)   Resp 18   Ht 4' 11 (1.499 m)   Wt 189 lb 6.4 oz (85.9 kg)   LMP 11/14/2023   SpO2 94%   BMI 38.25 kg/m   Physical Exam HENT:     Head: Normocephalic and atraumatic.     Nose: Nose normal.     Mouth/Throat:     Mouth: Mucous membranes are moist.     Pharynx: Oropharynx is clear.  Eyes:     General:  Lids are normal. Vision grossly intact. Gaze aligned appropriately.     Extraocular Movements: Extraocular movements intact.     Conjunctiva/sclera: Conjunctivae normal.     Pupils: Pupils are equal, round, and reactive to light.  Cardiovascular:     Rate and Rhythm: Normal rate and regular rhythm.     Pulses: Normal pulses.     Heart sounds: Normal heart sounds.  Pulmonary:     Effort: Pulmonary effort is normal.     Breath sounds: Normal breath sounds.  Musculoskeletal:        General: Normal range of motion.     Cervical back: Normal range of  motion and neck supple.  Neurological:     General: No focal deficit present.     Mental Status: She is alert and oriented to person, place, and time.  Psychiatric:        Mood and Affect: Mood normal.        Behavior: Behavior normal.    ASSESSMENT AND PLAN: 1. Type 2 diabetes mellitus with hyperglycemia, without long-term current use of insulin  (HCC) (Primary) - Hemoglobin A1c result pending. - Continue Metformin  as prescribed.  - Routine screening.  - Discussed the importance of healthy eating habits, low-carbohydrate diet, low-sugar diet, regular aerobic exercise (at least 150 minutes a week as tolerated) and medication compliance to achieve or maintain control of diabetes. Counseled on medication adherence/adverse effects.  - Referral to Endocrinology and Medical Nutrition Therapy for evaluation/management.  - Follow-up with primary provider as scheduled. - Microalbumin / creatinine urine ratio - POCT glycosylated hemoglobin (Hb A1C) - metFORMIN  (GLUCOPHAGE ) 500 MG tablet; Take 1 tablet (500 mg total) by mouth 2 (two) times daily with a meal.  Dispense: 60 tablet; Refill: 2 - Ambulatory referral to Endocrinology - Amb ref to Medical Nutrition Therapy-MNT - glucose blood (TRUE METRIX BLOOD GLUCOSE TEST) test strip; 1 each by Other route as needed. Use as instructed  Dispense: 100 each; Refill: 12 - TRUEplus Lancets 28G MISC; 1 each by  Other route as needed. Use as directed  Dispense: 100 each; Refill: 11  2. Diabetic eye exam (HCC) 3. Blurry vision, bilateral - Referral to Ophthalmology for evaluation/management. - Ambulatory referral to Ophthalmology  4. Encounter for diabetic foot exam [facility]) - Referral to Podiatry for evaluation/management. - Ambulatory referral to Podiatry  5. Anxiety and depression - Patient denies thoughts of self-harm, suicidal ideations, homicidal ideations. - Bupropion  as prescribed. Counseled on medication adherence/adverse effects.  - Patient declined referral to Psychiatry.  - Follow-up with primary provider in 4 weeks or sooner if needed.  - buPROPion  (WELLBUTRIN  XL) 150 MG 24 hr tablet; Take 1 tablet (150 mg total) by mouth daily.  Dispense: 90 tablet; Refill: 0  6. Encounter for smoking cessation counseling - Nicotine  patch as prescribed. Counseled on medication adherence/adverse effects. - Follow-up with primary provider in 4 weeks or sooner if needed.  - nicotine  (NICODERM CQ  - DOSED IN MG/24 HR) 7 mg/24hr patch; Place 1 patch (7 mg total) onto the skin daily. Remove old patch before applying new one.  Dispense: 28 patch; Refill: 2  7. Immunization due - Administered.  - Flu vaccine trivalent PF, 6mos and older(Flulaval,Afluria,Fluarix,Fluzone)    Patient was given the opportunity to ask questions.  Patient verbalized understanding of the plan and was able to repeat key elements of the plan. Patient was given clear instructions to go to Emergency Department or return to medical center if symptoms don't improve, worsen, or new problems develop.The patient verbalized understanding.   Orders Placed This Encounter  Procedures   Flu vaccine trivalent PF, 6mos and older(Flulaval,Afluria,Fluarix,Fluzone)   Microalbumin / creatinine urine ratio   Ambulatory referral to Ophthalmology   Ambulatory referral to Podiatry   Ambulatory referral to Endocrinology   Amb ref to Medical  Nutrition Therapy-MNT   POCT glycosylated hemoglobin (Hb A1C)     Requested Prescriptions   Signed Prescriptions Disp Refills   buPROPion  (WELLBUTRIN  XL) 150 MG 24 hr tablet 90 tablet 0    Sig: Take 1 tablet (150 mg total) by mouth daily.   metFORMIN  (GLUCOPHAGE ) 500 MG tablet 60 tablet 2  Sig: Take 1 tablet (500 mg total) by mouth 2 (two) times daily with a meal.   nicotine  (NICODERM CQ  - DOSED IN MG/24 HR) 7 mg/24hr patch 28 patch 2    Sig: Place 1 patch (7 mg total) onto the skin daily. Remove old patch before applying new one.   glucose blood (TRUE METRIX BLOOD GLUCOSE TEST) test strip 100 each 12    Sig: 1 each by Other route as needed. Use as instructed   TRUEplus Lancets 28G MISC 100 each 11    Sig: 1 each by Other route as needed. Use as directed    Return for Follow-Up or next available with Raguel Blush, MD.  Greig JINNY Drones, NP

## 2023-11-30 NOTE — Progress Notes (Signed)
Patient viewed results in mychart.

## 2023-12-01 ENCOUNTER — Other Ambulatory Visit (HOSPITAL_COMMUNITY): Payer: Self-pay

## 2023-12-02 ENCOUNTER — Other Ambulatory Visit (HOSPITAL_COMMUNITY): Payer: Self-pay

## 2023-12-03 ENCOUNTER — Other Ambulatory Visit: Payer: Self-pay | Admitting: Family

## 2023-12-03 ENCOUNTER — Other Ambulatory Visit: Payer: Self-pay

## 2023-12-03 ENCOUNTER — Other Ambulatory Visit (HOSPITAL_COMMUNITY): Payer: Self-pay

## 2023-12-03 ENCOUNTER — Emergency Department (HOSPITAL_COMMUNITY)
Admission: EM | Admit: 2023-12-03 | Discharge: 2023-12-04 | Discharge status: Against Medical Advice | Attending: Emergency Medicine | Admitting: Emergency Medicine

## 2023-12-03 ENCOUNTER — Encounter (HOSPITAL_COMMUNITY): Payer: Self-pay | Admitting: Emergency Medicine

## 2023-12-03 DIAGNOSIS — R55 Syncope and collapse: Secondary | ICD-10-CM | POA: Diagnosis present

## 2023-12-03 DIAGNOSIS — R110 Nausea: Secondary | ICD-10-CM | POA: Diagnosis not present

## 2023-12-03 DIAGNOSIS — E1165 Type 2 diabetes mellitus with hyperglycemia: Secondary | ICD-10-CM

## 2023-12-03 DIAGNOSIS — Z5321 Procedure and treatment not carried out due to patient leaving prior to being seen by health care provider: Secondary | ICD-10-CM | POA: Diagnosis not present

## 2023-12-03 LAB — COMPREHENSIVE METABOLIC PANEL WITH GFR
ALT: 24 U/L (ref 0–44)
AST: 19 U/L (ref 15–41)
Albumin: 4.2 g/dL (ref 3.5–5.0)
Alkaline Phosphatase: 63 U/L (ref 38–126)
Anion gap: 11 (ref 5–15)
BUN: 14 mg/dL (ref 6–20)
CO2: 22 mmol/L (ref 22–32)
Calcium: 10.2 mg/dL (ref 8.9–10.3)
Chloride: 99 mmol/L (ref 98–111)
Creatinine, Ser: 0.91 mg/dL (ref 0.44–1.00)
GFR, Estimated: >60 mL/min (ref >60)
Glucose, Bld: 294 mg/dL — ABNORMAL HIGH (ref 70–99)
Potassium: 4.3 mmol/L (ref 3.5–5.1)
Sodium: 132 mmol/L — ABNORMAL LOW (ref 135–145)
Total Bilirubin: 0.3 mg/dL (ref 0.0–1.2)
Total Protein: 7 g/dL (ref 6.5–8.1)

## 2023-12-03 LAB — CBC
HCT: 35.8 % — ABNORMAL LOW (ref 36.0–46.0)
Hemoglobin: 11.9 g/dL — ABNORMAL LOW (ref 12.0–15.0)
MCH: 31.6 pg (ref 26.0–34.0)
MCHC: 33.2 g/dL (ref 30.0–36.0)
MCV: 95.2 fL (ref 80.0–100.0)
Platelets: 267 K/uL (ref 150–400)
RBC: 3.76 MIL/uL — ABNORMAL LOW (ref 3.87–5.11)
RDW: 11.9 % (ref 11.5–15.5)
WBC: 6 K/uL (ref 4.0–10.5)
nRBC: 0 % (ref 0.0–0.2)

## 2023-12-03 LAB — CBG MONITORING, ED: Glucose-Capillary: 301 mg/dL — ABNORMAL HIGH (ref 70–99)

## 2023-12-03 LAB — HCG, SERUM, QUALITATIVE: Preg, Serum: NEGATIVE

## 2023-12-03 MED ORDER — TRUEPLUS LANCETS 28G MISC
1.0000 | Freq: Three times a day (TID) | 11 refills | Status: DC
  Filled 2023-12-03: qty 100, 25d supply, fill #0
  Filled 2023-12-03: qty 100, fill #0
  Filled 2023-12-03: qty 100, 25d supply, fill #0
  Filled 2023-12-25: qty 100, 30d supply, fill #0

## 2023-12-03 MED ORDER — TRUE METRIX BLOOD GLUCOSE TEST VI STRP
1.0000 | ORAL_STRIP | Freq: Three times a day (TID) | 12 refills | Status: DC
  Filled 2023-12-03 (×2): qty 100, 25d supply, fill #0

## 2023-12-03 NOTE — Telephone Encounter (Signed)
 Complete

## 2023-12-03 NOTE — Telephone Encounter (Signed)
 Copied from CRM [id number]. Topic: Clinical - Prescription Issue >> Dec 03, 2023 10:47 AM Gustabo D wrote: The pharmacy won't fill the prescription for her strips- they need to know how many times she checks her blood sugar. She is out of strips.

## 2023-12-03 NOTE — ED Triage Notes (Signed)
 Patient c/o syncope episode x 45 mins ago. Patient report she's not been better since she was diagnose with DM last week. Patient report she havn't talked to her PCP yet for her DM. Patient denies LOC. Patient report nausea , denies vomiting.

## 2023-12-04 ENCOUNTER — Ambulatory Visit: Attending: Nurse Practitioner | Admitting: Nurse Practitioner

## 2023-12-04 ENCOUNTER — Ambulatory Visit: Payer: Self-pay

## 2023-12-04 DIAGNOSIS — R7309 Other abnormal glucose: Secondary | ICD-10-CM

## 2023-12-04 NOTE — Telephone Encounter (Signed)
Pt scheduled for tomorrow afternoon.

## 2023-12-04 NOTE — Progress Notes (Signed)
 Has f/u appt with PCP tomorrow

## 2023-12-04 NOTE — Telephone Encounter (Signed)
 True Metrix Blood Glucose Test Strips prescribed on 12/03/2023 and Sig attached on prescription on the same date.Refer to/review below: Sig - Route: Use to test blood glucose 4 (four) times daily -  before meals and at bedtime. - Other   Sent to pharmacy as: glucose blood (TRUE METRIX BLOOD GLUCOSE TEST) test strip

## 2023-12-04 NOTE — Telephone Encounter (Signed)
 FYI Only or Action Required?: FYI only for provider.  Patient was last seen in primary care on 11/30/2023 by Lorren Greig PARAS, NP.  Called Nurse Triage reporting Blood Sugar Problem.  Symptoms began x week.  Interventions attempted: Prescription medications: Diabetes medication.  Symptoms are: gradually worsening.  Triage Disposition: See Physician Within 24 Hours  Patient/caregiver understands and will follow disposition?: Yes    Copied from CRM [id number]. Topic: Clinical - Red Word Triage >> Dec 04, 2023 12:10 PM DeAngela L wrote: Red Word that prompted transfer to Nurse Triage: patient is having dizziness and also extremely tired, not having an appetite, and a little dry mouth, and her sugar is still high last night 384 she says her sugar keeps being high and this is when she feel dizziness the most patient has fallen also, and patient is asking if the doctor can change the  metFORMIN  (GLUCOPHAGE ) 500 MG tablet  Pt num [phone] (M) Reason for Disposition  [1] Blood glucose > 240 mg/dL (86.6 mmol/L) AND [7] pregnant    FSBS today fasting 222  Answer Assessment - Initial Assessment Questions 1. BLOOD GLUCOSE: What is your blood glucose level?      Today at 1234 pm pt took FSBS 222:fasting 2. ONSET: When did you check the blood glucose?     X week 3. USUAL RANGE: What is your glucose level usually? (e.g., usual fasting morning value, usual evening value)     Unknown - newly diagnosed type 2 DM 4. KETONES: Do you check for ketones (urine or blood test strips)? If Yes, ask: What does the test show now?      no 5. TYPE 1 or 2:  Do you know what type of diabetes you have?  (e.g., Type 1, Type 2, Gestational; doesn't know)      Type 2  6. INSULIN : Do you take insulin ? What type of insulin (s) do you use? What is the mode of delivery? (syringe, pen; injection or pump)?      na 7. DIABETES PILLS: Do you take any pills for your diabetes? If Yes, ask: Have you missed  taking any pills recently?    no 8. OTHER SYMPTOMS: Do you have any symptoms? (e.g., fever, frequent urination, difficulty breathing, dizziness, weakness, vomiting)     Dizzy, nausea, weakness, watery mouth 9. PREGNANCY: Is there any chance you are pregnant? When was your last menstrual period?     Na  Last night FSBS 334: pt takes all she wants to do is sleep since she began taking the metformin .  Pt also c/o dizziness and x1 fall: due to weakness and dizziness.  Pt is newly diagnosed type 2.  Scheduled video appt per pt request.  Protocols used: Diabetes - High Blood Sugar-A-AH

## 2023-12-05 ENCOUNTER — Inpatient Hospital Stay: Admitting: Family Medicine

## 2023-12-05 ENCOUNTER — Ambulatory Visit: Admitting: Family Medicine

## 2023-12-06 LAB — MICROALBUMIN / CREATININE URINE RATIO
Creatinine, Urine: 149.2 mg/dL
Microalb/Creat Ratio: 8 mg/g{creat} (ref 0–29)
Microalbumin, Urine: 12.5 ug/mL

## 2023-12-10 ENCOUNTER — Encounter: Payer: Self-pay | Admitting: Podiatry

## 2023-12-10 ENCOUNTER — Telehealth: Payer: Self-pay | Admitting: Family Medicine

## 2023-12-10 ENCOUNTER — Ambulatory Visit: Admitting: Podiatry

## 2023-12-10 DIAGNOSIS — E119 Type 2 diabetes mellitus without complications: Secondary | ICD-10-CM

## 2023-12-10 NOTE — Telephone Encounter (Signed)
 A document form from [employer] has been faxed: FMLA, to be filled out by provider. Send document back via Fax within 7-days. Document is located in providers tray at front office.          Fax number: [phone]

## 2023-12-10 NOTE — Progress Notes (Signed)
  Subjective:  Patient ID: [name], female    DOB: 1980/04/02,   MRN: [id number]  Chief Complaint  Patient presents with   Diabetes    I'm Diabetic.  I guess they're going to check my feet.    43 y.o. female presents for diabetic foot check. Denies burning and tingling in their feet. Patient is diabetic and last A1c was  Lab Results  Component Value Date   HGBA1C 9.7 (A) 11/30/2023   .     [name] Denies any other pedal complaints. Denies n/v/f/c.   History reviewed. No pertinent past medical history.  Objective:  Physical Exam: Vascular: DP/PT pulses 2/4 bilateral. CFT <3 seconds. Normal hair growth on digits. No edema.  Skin. No lacerations or abrasions bilateral feet. Nails 1-5 bilateral normal in appearance. Some discoloration of the great toenail on the left Musculoskeletal: MMT 5/5 bilateral lower extremities in DF, PF, Inversion and Eversion. Deceased ROM in DF of ankle joint.  Neurological: Sensation intact to light touch.   Assessment:   1. Type 2 diabetes mellitus without complication, unspecified whether long term insulin  use (HCC)      Plan:  Patient was evaluated and treated and all questions answered. -Discussed and educated patient on diabetic foot care, especially with  regards to the vascular, neurological and musculoskeletal systems.  -Stressed the importance of good glycemic control and the detriment of not  controlling glucose levels in relation to the foot. -Discussed supportive shoes at all times and checking feet regularly.  -Answered all patient questions -Patient to return  in 1 year for diabetic foot hceck.  -Patient advised to call the office if any problems or questions arise in the meantime.   [name], DPM

## 2023-12-10 NOTE — Telephone Encounter (Signed)
 Pt scheduled for tomorrow morning.

## 2023-12-11 ENCOUNTER — Ambulatory Visit: Admitting: Family Medicine

## 2023-12-11 ENCOUNTER — Other Ambulatory Visit (HOSPITAL_COMMUNITY): Payer: Self-pay

## 2023-12-11 VITALS — BP 114/75 | HR 72 | Ht 59.0 in | Wt 187.6 lb

## 2023-12-11 DIAGNOSIS — E1165 Type 2 diabetes mellitus with hyperglycemia: Secondary | ICD-10-CM | POA: Diagnosis not present

## 2023-12-11 DIAGNOSIS — H538 Other visual disturbances: Secondary | ICD-10-CM | POA: Diagnosis not present

## 2023-12-11 DIAGNOSIS — F32A Depression, unspecified: Secondary | ICD-10-CM

## 2023-12-11 DIAGNOSIS — F419 Anxiety disorder, unspecified: Secondary | ICD-10-CM | POA: Diagnosis not present

## 2023-12-11 DIAGNOSIS — F17210 Nicotine dependence, cigarettes, uncomplicated: Secondary | ICD-10-CM

## 2023-12-11 DIAGNOSIS — Z0289 Encounter for other administrative examinations: Secondary | ICD-10-CM

## 2023-12-11 DIAGNOSIS — Z7984 Long term (current) use of oral hypoglycemic drugs: Secondary | ICD-10-CM

## 2023-12-11 MED ORDER — FREESTYLE LIBRE 3 PLUS SENSOR MISC
0 refills | Status: DC
  Filled 2023-12-11: qty 6, 90d supply, fill #0

## 2023-12-12 ENCOUNTER — Encounter: Payer: Self-pay | Admitting: Family Medicine

## 2023-12-12 NOTE — Progress Notes (Signed)
 Established Patient Office Visit  Subjective    Patient ID: [name], female    DOB: 29-Apr-1980  Age: 43 y.o. MRN: [id number]  CC:  Chief Complaint  Patient presents with   Medical Management of Chronic Issues    FMLA paperwork  Also wants another refill of vitamin D      HPI PERLE BRICKHOUSE presents for follow up of recent ED visits where patient was recently dx with diabetes with RBS 500+. Patient was started on med and reports she continues with some eye focusing problems.   Outpatient Encounter Medications as of 12/11/2023  Medication Sig   buPROPion  (WELLBUTRIN  XL) 150 MG 24 hr tablet Take 1 tablet (150 mg total) by mouth daily.   Continuous Glucose Sensor (FREESTYLE LIBRE 3 PLUS SENSOR) MISC Change sensor every 15 days.   glucose blood (TRUE METRIX BLOOD GLUCOSE TEST) test strip Use to test blood glucose 4 (four) times daily -  before meals and at bedtime.   metFORMIN  (GLUCOPHAGE ) 500 MG tablet Take 1 tablet (500 mg total) by mouth 2 (two) times daily with a meal.   TRUEplus Lancets 28G MISC Use to test blood glucose 4 (four) times daily -  before meals and at bedtime.   albuterol  (VENTOLIN  HFA) 108 (90 Base) MCG/ACT inhaler Inhale 2 puffs into the lungs every 6  hours as needed for wheezing or shortness of breath. (Patient not taking: Reported on 12/11/2023)   Carboxymethylcellulose Sodium (EYE DROPS) 0.5 % SOLN Apply 2 drops to eye in the morning, at noon, in the evening, and at bedtime. (Patient not taking: Reported on 11/26/2023)   clindamycin  (CLEOCIN ) 2 % vaginal cream Place 1 Applicatorful vaginally at bedtime. (Patient not taking: Reported on 11/26/2023)   erythromycin  ophthalmic ointment Place a 1/2 inch ribbon of ointment into the lower eyelid. (Patient not taking: Reported on 11/26/2023)   fluconazole  (DIFLUCAN ) 150 MG tablet Take 1 tablet (150 mg total) by mouth daily. (Patient not taking: Reported on 12/10/2023)   fluticasone  (FLONASE ) 50 MCG/ACT nasal spray Place 2  sprays into both nostrils daily. (Patient not taking: Reported on 11/26/2023)   nicotine  (NICODERM CQ  - DOSED IN MG/24 HR) 7 mg/24hr patch Place 1 patch (7 mg total) onto the skin daily. Remove old patch before applying new one. (Patient not taking: Reported on 12/10/2023)   Nicotine  (NICODERM CQ  TD) Place 1 patch onto the skin daily as needed (for smoking cessation while hospitalized). (Patient not taking: Reported on 12/10/2023)   nicotine  (NICODERM CQ ) 21 mg/24hr patch Place 1 patch (21 mg total) onto the skin daily. (Patient not taking: Reported on 11/26/2023)   Spacer/Aero-Holding Chambers DEVI 1 each by Does not apply route as needed. (Patient not taking: Reported on 11/26/2023)   Vitamin D , Ergocalciferol , (DRISDOL ) 1.25 MG (50000 UNIT) CAPS capsule Take 1 capsule by mouth every 7 days. (Patient not taking: Reported on 12/11/2023)   No facility-administered encounter medications on file as of 12/11/2023.    History reviewed. No pertinent past medical history.  Past Surgical History:  Procedure Laterality Date   CESAREAN SECTION      Family History  Problem Relation Age of Onset   Diabetes Mother    Diabetes Father    Heart Problems Father    Hypertension Father     Social History   Socioeconomic History   Marital status: Single    Spouse name: Not on file   Number of children: Not on file   Years of education: Not on  file   Highest education level: 12th grade  Occupational History   Not on file  Tobacco Use   Smoking status: Every Day    Current packs/day: 0.50    Types: Cigarettes   Smokeless tobacco: Never  Substance and Sexual Activity   Alcohol use: Not Currently    Comment: socially   Drug use: No   Sexual activity: Yes    Birth control/protection: None  Other Topics Concern   Not on file  Social History Narrative   Not on file   Social Drivers of Health   Financial Resource Strain: Medium Risk (12/11/2023)   Overall Financial Resource Strain (CARDIA)     Difficulty of Paying Living Expenses: Somewhat hard  Food Insecurity: Food Insecurity Present (12/11/2023)   Hunger Vital Sign    Worried About Running Out of Food in the Last Year: Sometimes true    Ran Out of Food in the Last Year: Sometimes true  Transportation Needs: No Transportation Needs (12/11/2023)   PRAPARE - Administrator, Civil Service (Medical): No    Lack of Transportation (Non-Medical): No  Physical Activity: Sufficiently Active (12/11/2023)   Exercise Vital Sign    Days of Exercise per Week: 7 days    Minutes of Exercise per Session: 30 min  Stress: No Stress Concern Present (12/11/2023)   [employer] of Occupational Health - Occupational Stress Questionnaire    Feeling of Stress: Only a little  Social Connections: Moderately Isolated (12/11/2023)   Social Connection and Isolation Panel    Frequency of Communication with Friends and Family: More than three times a week    Frequency of Social Gatherings with Friends and Family: Three times a week    Attends Religious Services: 1 to 4 times per year    Active Member of Clubs or Organizations: No    Attends Banker Meetings: Not on file    Marital Status: Divorced  Intimate Partner Violence: Not At Risk (11/30/2023)   Humiliation, Afraid, Rape, and Kick questionnaire    Fear of Current or Ex-Partner: No    Emotionally Abused: No    Physically Abused: No    Sexually Abused: No    Review of Systems  All other systems reviewed and are negative.       Objective    BP 114/75   Pulse 72   Ht 4' 11 (1.499 m)   Wt 187 lb 9.6 oz (85.1 kg)   LMP 11/14/2023 (Approximate)   SpO2 96%   BMI 37.89 kg/m   Physical Exam Vitals and nursing note reviewed.  Constitutional:      General: She is not in acute distress. Cardiovascular:     Rate and Rhythm: Normal rate and regular rhythm.  Pulmonary:     Effort: Pulmonary effort is normal.     Breath sounds: Normal breath sounds.  Abdominal:      Palpations: Abdomen is soft.     Tenderness: There is no abdominal tenderness.  Neurological:     General: No focal deficit present.     Mental Status: She is alert and oriented to person, place, and time.         Assessment & Plan:  1. Type 2 diabetes mellitus with hyperglycemia, without long-term current use of insulin  (HCC) (Primary) Patient presently on metformin  and scheduled for diabetic education. Referred to [facility] for med management  2. Blurry vision, bilateral 2/2 above. Get RBS under control  3. Anxiety and depression 2/2 new diagnosis?  4. Encounter for completion of form with patient FMLA completed  d   No follow-ups on file.   Tanda Raguel SQUIBB, MD

## 2023-12-13 ENCOUNTER — Other Ambulatory Visit (HOSPITAL_COMMUNITY): Payer: Self-pay

## 2023-12-14 ENCOUNTER — Other Ambulatory Visit (HOSPITAL_COMMUNITY): Payer: Self-pay

## 2023-12-14 NOTE — Telephone Encounter (Signed)
 Patient was here for an appointment on 12/11/2023

## 2023-12-18 ENCOUNTER — Telehealth (HOSPITAL_COMMUNITY): Payer: Self-pay | Admitting: Pharmacy Technician

## 2023-12-18 ENCOUNTER — Other Ambulatory Visit (HOSPITAL_COMMUNITY): Payer: Self-pay

## 2023-12-18 NOTE — Telephone Encounter (Signed)
 PA request has been Started. Waiting for clinical questions to populate.

## 2023-12-19 ENCOUNTER — Other Ambulatory Visit (HOSPITAL_COMMUNITY): Payer: Self-pay

## 2023-12-23 ENCOUNTER — Other Ambulatory Visit (HOSPITAL_COMMUNITY): Payer: Self-pay

## 2023-12-25 ENCOUNTER — Other Ambulatory Visit (HOSPITAL_COMMUNITY): Payer: Self-pay

## 2023-12-26 ENCOUNTER — Telehealth: Payer: Self-pay | Admitting: Family Medicine

## 2023-12-26 ENCOUNTER — Telehealth: Payer: Self-pay

## 2023-12-26 NOTE — Telephone Encounter (Signed)
 Spoke to pt to let her know flu shot given is on her AVS from the OV on 09/05. Pt stated she does not want to use the AVS for proof for her job because it has other information that she does not want to share. Pt is requesting proof of her flu shot sent to her email provided on the last note.

## 2023-12-26 NOTE — Telephone Encounter (Signed)
 Copied from CRM #[id number]. Topic: Medical Record Request - Records Request >> Dec 25, 2023  4:26 PM DeAngela L wrote: Reason for CRM: patient calling about her flu shot from her appointment on 11/30/23 she would like to ask if she can have proof that she received a flu shot then or can this be available in mychart so she can get a copy from there or can this be emailed to her so she can print the information  [email or web address]  Pt num [phone] (M)

## 2023-12-26 NOTE — Telephone Encounter (Signed)
 Copied from CRM #[id number]. Topic: Clinical - Medication Question >> Dec 25, 2023  4:31 PM DeAngela L wrote: Reason for CRM: patient is calling to check the status of her prescription for the [facility] 3 since she hasn't heard anything additional and wondering if her insurance approved the PA  Patient just spoke with her pharmacy and they mentioned they sent another request about the freestyle libre 3  Pt num [phone] (M)

## 2023-12-27 ENCOUNTER — Other Ambulatory Visit (HOSPITAL_COMMUNITY): Payer: Self-pay

## 2023-12-27 NOTE — Telephone Encounter (Signed)
 Letter sent to pt's mychart stating she had her influenza vaccine done at our office on 11/30/23

## 2023-12-27 NOTE — Telephone Encounter (Signed)
 I apologize for the delay. When I sent the prior auth it closed it, stating the patient's insurance is not active.

## 2023-12-28 ENCOUNTER — Other Ambulatory Visit: Payer: Self-pay

## 2023-12-28 ENCOUNTER — Other Ambulatory Visit (HOSPITAL_COMMUNITY): Payer: Self-pay

## 2023-12-28 MED ORDER — ACCU-CHEK GUIDE W/DEVICE KIT
PACK | 0 refills | Status: AC
  Filled 2023-12-28: qty 1, 30d supply, fill #0

## 2023-12-28 MED ORDER — ACCU-CHEK GUIDE TEST VI STRP
ORAL_STRIP | 6 refills | Status: AC
  Filled 2023-12-28: qty 100, 33d supply, fill #0
  Filled 2024-01-22: qty 100, 33d supply, fill #1

## 2023-12-28 MED ORDER — ACCU-CHEK SOFTCLIX LANCETS MISC
6 refills | Status: AC
  Filled 2023-12-28: qty 100, 33d supply, fill #0
  Filled 2024-01-22: qty 100, 33d supply, fill #1

## 2023-12-28 NOTE — Addendum Note (Signed)
 Addended by: FLEETA MORRIS, GARNETTE L on: 12/28/2023 12:41 PM   Modules accepted: Orders

## 2023-12-28 NOTE — Telephone Encounter (Signed)
 Yes ma'am, rxns for Accu Chek sent.

## 2023-12-29 ENCOUNTER — Other Ambulatory Visit (HOSPITAL_COMMUNITY): Payer: Self-pay

## 2024-01-01 ENCOUNTER — Other Ambulatory Visit (HOSPITAL_COMMUNITY): Payer: Self-pay

## 2024-01-02 ENCOUNTER — Other Ambulatory Visit (HOSPITAL_COMMUNITY): Payer: Self-pay

## 2024-01-03 ENCOUNTER — Other Ambulatory Visit (HOSPITAL_COMMUNITY): Payer: Self-pay

## 2024-01-08 ENCOUNTER — Other Ambulatory Visit (HOSPITAL_BASED_OUTPATIENT_CLINIC_OR_DEPARTMENT_OTHER): Payer: Self-pay

## 2024-01-14 ENCOUNTER — Encounter: Attending: Family | Admitting: Skilled Nursing Facility1

## 2024-01-14 ENCOUNTER — Encounter: Payer: Self-pay | Admitting: Skilled Nursing Facility1

## 2024-01-14 VITALS — Wt 189.0 lb

## 2024-01-14 DIAGNOSIS — E119 Type 2 diabetes mellitus without complications: Secondary | ICD-10-CM | POA: Insufficient documentation

## 2024-01-14 NOTE — Progress Notes (Signed)
 Appt start: 10:31 end time: 11:35  A1C 9.7  DM medications: Metformin   Pt states she does check her blood sugars seeing about 150's. 110 fasting. Pt states she cut out soda. Pt states she eats out a lot because she lives alone and feels exhausted from working all of the time ([employer] with [employer]).   Pt states she sometimes wakes around 8-11am.  Pts work is active.  Pt states her doctor did get her patches so she does plan on trying to quit smoking.     Diabetes Self-Management Education  Visit Type: First/Initial  01/14/2024  Ms. [name], identified by name and date of birth, is a 43 y.o. female with a diagnosis of Diabetes: Type 2.   ASSESSMENT  Weight 189 lb (85.7 kg). Body mass index is 38.17 kg/m.    Individualized Plan for Diabetes Self-Management Training:   Learning Objective:  Patient will have a greater understanding of diabetes self-management. Patient education plan is to attend individual and/or group sessions per assessed needs and concerns.    Expected Outcomes:  Demonstrated interest in learning. Expect positive outcomes  Education material provided: ADA - How to Thrive: A Guide for Your Journey with Diabetes, Food label handouts, A1C conversion sheet, Meal plan card, My Plate, and Snack sheet  If problems or questions, patient to contact team via:  Phone and Email  Future DSME appointment: 3-4 months

## 2024-01-16 ENCOUNTER — Encounter: Payer: Self-pay | Admitting: Family Medicine

## 2024-01-16 ENCOUNTER — Ambulatory Visit: Admitting: Family Medicine

## 2024-01-16 VITALS — BP 118/81 | HR 76 | Ht 59.0 in | Wt 186.6 lb

## 2024-01-16 DIAGNOSIS — E66812 Obesity, class 2: Secondary | ICD-10-CM | POA: Diagnosis not present

## 2024-01-16 DIAGNOSIS — Z6837 Body mass index (BMI) 37.0-37.9, adult: Secondary | ICD-10-CM

## 2024-01-16 DIAGNOSIS — Z0000 Encounter for general adult medical examination without abnormal findings: Secondary | ICD-10-CM

## 2024-01-16 DIAGNOSIS — F418 Other specified anxiety disorders: Secondary | ICD-10-CM

## 2024-01-16 DIAGNOSIS — F32A Depression, unspecified: Secondary | ICD-10-CM

## 2024-01-16 DIAGNOSIS — E1165 Type 2 diabetes mellitus with hyperglycemia: Secondary | ICD-10-CM | POA: Diagnosis not present

## 2024-01-16 DIAGNOSIS — F17200 Nicotine dependence, unspecified, uncomplicated: Secondary | ICD-10-CM

## 2024-01-16 DIAGNOSIS — F17210 Nicotine dependence, cigarettes, uncomplicated: Secondary | ICD-10-CM

## 2024-01-16 NOTE — Progress Notes (Unsigned)
 Established Patient Office Visit  Subjective    Patient ID: [name], female    DOB: [birth year]  Age: 43 y.o. MRN: [id number]  CC:  Chief Complaint  Patient presents with   Medical Management of Chronic Issues    High blood glucose     HPI [name] presents for follow up of diabetes. Patient reports that she is   Outpatient Encounter Medications as of 01/16/2024  Medication Sig   Accu-Chek Softclix Lancets lancets Use to check blood sugar 3 times daily.   Blood Glucose Monitoring Suppl (ACCU-CHEK GUIDE) w/Device KIT Use to check blood sugar 3 times daily.   Continuous Glucose Sensor (FREESTYLE LIBRE 3 PLUS SENSOR) MISC Change sensor every 15 days.   glucose blood (ACCU-CHEK GUIDE TEST) test strip Use to check blood sugar 3 times daily.   metFORMIN  (GLUCOPHAGE ) 500 MG tablet Take 1 tablet (500 mg total) by mouth 2 (two) times daily with a meal.   albuterol  (VENTOLIN  HFA) 108 (90 Base) MCG/ACT inhaler Inhale 2 puffs into the lungs every 6  hours as needed for wheezing or shortness of breath. (Patient not taking: Reported on 12/11/2023)   buPROPion  (WELLBUTRIN  XL) 150 MG 24 hr tablet Take 1 tablet (150 mg total) by mouth daily. (Patient not taking: Reported on 01/16/2024)   fluconazole  (DIFLUCAN ) 150 MG tablet Take 1 tablet (150 mg total) by mouth daily. (Patient not taking: Reported on 12/10/2023)   nicotine  (NICODERM CQ  - DOSED IN MG/24 HR) 7 mg/24hr patch Place 1 patch (7 mg total) onto the skin daily. Remove old patch before applying new one. (Patient not taking: Reported on 12/10/2023)   Nicotine  (NICODERM CQ  TD) Place 1 patch onto the skin daily as needed (for smoking cessation while hospitalized). (Patient not taking: Reported on 12/10/2023)   Vitamin D , Ergocalciferol , (DRISDOL ) 1.25 MG (50000 UNIT) CAPS capsule Take 1 capsule by mouth every 7 days. (Patient not taking: Reported on 12/11/2023)   No facility-administered encounter medications on file as of 01/16/2024.     History reviewed. No pertinent past medical history.  Past Surgical History:  Procedure Laterality Date   CESAREAN SECTION      Family History  Problem Relation Age of Onset   Diabetes Mother    Diabetes Father    Heart Problems Father    Hypertension Father     Social History   Socioeconomic History   Marital status: Single    Spouse name: Not on file   Number of children: Not on file   Years of education: Not on file   Highest education level: 12th grade  Occupational History   Not on file  Tobacco Use   Smoking status: Every Day    Current packs/day: 0.50    Types: Cigarettes   Smokeless tobacco: Never  Substance and Sexual Activity   Alcohol use: Not Currently    Comment: socially   Drug use: No   Sexual activity: Yes    Birth control/protection: None  Other Topics Concern   Not on file  Social History Narrative   Not on file   Social Drivers of Health   Financial Resource Strain: High Risk (01/15/2024)   Overall Financial Resource Strain (CARDIA)    Difficulty of Paying Living Expenses: Hard  Food Insecurity: Food Insecurity Present (01/15/2024)   Hunger Vital Sign    Worried About Running Out of Food in the Last Year: Often true    Ran Out of Food in the Last Year: Often  true  Transportation Needs: No Transportation Needs (01/15/2024)   PRAPARE - Administrator, Civil Service (Medical): No    Lack of Transportation (Non-Medical): No  Physical Activity: Sufficiently Active (01/15/2024)   Exercise Vital Sign    Days of Exercise per Week: 5 days    Minutes of Exercise per Session: 30 min  Stress: Stress Concern Present (01/15/2024)   [employer] of Occupational Health - Occupational Stress Questionnaire    Feeling of Stress: To some extent  Social Connections: Moderately Isolated (01/15/2024)   Social Connection and Isolation Panel    Frequency of Communication with Friends and Family: More than three times a week    Frequency  of Social Gatherings with Friends and Family: Once a week    Attends Religious Services: 1 to 4 times per year    Active Member of [employer] or Organizations: No    Attends Engineer, structural: Not on file    Marital Status: Divorced  Intimate Partner Violence: Not At Risk (11/30/2023)   Humiliation, Afraid, Rape, and Kick questionnaire    Fear of Current or Ex-Partner: No    Emotionally Abused: No    Physically Abused: No    Sexually Abused: No    ROS      Objective    BP 118/81   Pulse 76   Ht 4' 11 (1.499 m)   Wt 186 lb 9.6 oz (84.6 kg)   LMP 01/07/2024   SpO2 96%   BMI 37.69 kg/m   Physical Exam      Assessment & Plan:   1. Type 2 diabetes mellitus with hyperglycemia, without long-term current use of insulin  (HCC) (Primary) Latest A1c elevated above goal. Discussed compliance.   2. Anxiety and depression Appears stable.   3. Class 2 severe obesity due to excess calories with serious comorbidity and body mass index (BMI) of 37.0 to 37.9 in adult   4. Smoker Discussed cessation/reduction   No follow-ups on file.   Tanda Raguel SQUIBB, MD

## 2024-01-17 ENCOUNTER — Ambulatory Visit: Attending: Family Medicine | Admitting: Pharmacist

## 2024-01-17 ENCOUNTER — Encounter: Payer: Self-pay | Admitting: Pharmacist

## 2024-01-17 DIAGNOSIS — Z7984 Long term (current) use of oral hypoglycemic drugs: Secondary | ICD-10-CM | POA: Diagnosis not present

## 2024-01-17 DIAGNOSIS — E1165 Type 2 diabetes mellitus with hyperglycemia: Secondary | ICD-10-CM

## 2024-01-17 NOTE — Progress Notes (Signed)
    S:     No chief complaint on file.  43 y.o. female who presents for diabetes evaluation, education, and management. Patient arrives in good spirits and presents without any assistance.   Patient was referred and last seen by Primary Care Provider, Dr. Tanda, on 01/16/2024.   PMH is significant for newly diagnosed DM. Patient reports Diabetes was diagnosed in September of this year. Blurred vision and fatigue led her to seek urgent care, where A1c was 9.7%. Admits to heavy soda consumption prior to dx. Since, has cut out soda. No hx of ASCVD, CKD, or CHF. No thyroid cancer or pancreatitis.   Family/Social History:  Fhx: T2DM Tobacco: current 0.5 PPD smoker  Alcohol: none reported  Current diabetes medications include: metformin  500 mg BID Patient reports adherence to taking all medications as prescribed.   Insurance coverage: [place] Medicaid  Patient denies hypoglycemic events.  Reported home fasting blood sugars: 120s  Reported 2 hour post-meal/random blood sugars: 130s-140s.  Patient denies polyuria.  Patient denies neuropathy (nerve pain). Patient reports visual changes. Patient denies self foot exams.   Patient reported dietary habits:  -Admits to eating some fried food and snack foods -Eats out a lot  -Snacks: has some sweet cravings  -Drinks: water, Zero calorie soda   Patient-reported exercise habits:  -Walks ~8 hours during her shift -Walks 1-2x weekly; walks about an hour  O:   Lab Results  Component Value Date   HGBA1C 9.7 (A) 11/30/2023   There were no vitals filed for this visit.  Lipid Panel     Component Value Date/Time   CHOL 167 06/30/2021 1125   TRIG 94 06/30/2021 1125   HDL 41 06/30/2021 1125   CHOLHDL 4.1 06/30/2021 1125   LDLCALC 109 (H) 06/30/2021 1125   Clinical Atherosclerotic Cardiovascular Disease (ASCVD): No  The 10-year ASCVD risk score (Arnett DK, et al., 2019) is: 4.6%   Values used to calculate the score:     Age: 63  years     Clincally relevant sex: Female     Is Non-Hispanic African American: Yes     Diabetic: Yes     Tobacco smoker: Yes     Systolic Blood Pressure: 118 mmHg     Is BP treated: No     HDL Cholesterol: 41 mg/dL     Total Cholesterol: 167 mg/dL   Patient is participating in a Managed Medicaid Plan: No    A/P: Diabetes newly diagnosed currently above goal with A1c of 9.7%. However, reported home CBG readings are at goal. Patient is not currently symptomatic but is able to verbalize appropriate hypoglycemia management plan. Medication adherence appears to be appropriate. Will hold off on changes at this time.  -Continued metformin  500 mg BID. -Patient educated on purpose, proper use, and potential adverse effects of metformin .  -Extensively discussed pathophysiology of diabetes, recommended lifestyle interventions, dietary effects on blood sugar control.  -Counseled on s/sx of and management of hypoglycemia.  -Next A1c anticipated 02/2024.   Written patient instructions provided. Patient verbalized understanding of treatment plan.  Total time in face to face counseling 30 minutes.    Follow-up:  Pharmacist in 6 weeks.  Herlene Fleeta Morris, PharmD, JAQUELINE, CPP Clinical Pharmacist [facility] [phone]

## 2024-01-25 ENCOUNTER — Other Ambulatory Visit (HOSPITAL_COMMUNITY): Payer: Self-pay

## 2024-01-25 ENCOUNTER — Other Ambulatory Visit (HOSPITAL_BASED_OUTPATIENT_CLINIC_OR_DEPARTMENT_OTHER): Payer: Self-pay

## 2024-01-25 MED ORDER — METFORMIN HCL 500 MG PO TABS
ORAL_TABLET | ORAL | 1 refills | Status: DC
  Filled 2024-01-25: qty 180, 90d supply, fill #0

## 2024-02-07 ENCOUNTER — Emergency Department (HOSPITAL_COMMUNITY)
Admission: EM | Admit: 2024-02-07 | Discharge: 2024-02-07 | Disposition: A | Discharge status: Home / Self Care | Attending: Emergency Medicine | Admitting: Emergency Medicine

## 2024-02-07 ENCOUNTER — Other Ambulatory Visit: Payer: Self-pay

## 2024-02-07 DIAGNOSIS — E162 Hypoglycemia, unspecified: Secondary | ICD-10-CM

## 2024-02-07 DIAGNOSIS — Z7984 Long term (current) use of oral hypoglycemic drugs: Secondary | ICD-10-CM | POA: Diagnosis not present

## 2024-02-07 DIAGNOSIS — E11649 Type 2 diabetes mellitus with hypoglycemia without coma: Secondary | ICD-10-CM | POA: Insufficient documentation

## 2024-02-07 LAB — CBG MONITORING, ED: Glucose-Capillary: 80 mg/dL (ref 70–99)

## 2024-02-07 LAB — URINALYSIS, ROUTINE W REFLEX MICROSCOPIC
Bilirubin Urine: NEGATIVE
Glucose, UA: NEGATIVE mg/dL
Ketones, ur: NEGATIVE mg/dL
Leukocytes,Ua: NEGATIVE
Nitrite: NEGATIVE
Protein, ur: NEGATIVE mg/dL
Specific Gravity, Urine: 1.019 (ref 1.005–1.030)
pH: 5 (ref 5.0–8.0)

## 2024-02-07 LAB — PREGNANCY, URINE: Preg Test, Ur: NEGATIVE

## 2024-02-07 LAB — CBC WITH DIFFERENTIAL/PLATELET
Abs Immature Granulocytes: 0.01 K/uL (ref 0.00–0.07)
Basophils Absolute: 0 K/uL (ref 0.0–0.1)
Basophils Relative: 1 %
Eosinophils Absolute: 0.1 K/uL (ref 0.0–0.5)
Eosinophils Relative: 2 %
HCT: 36.6 % (ref 36.0–46.0)
Hemoglobin: 11.9 g/dL — ABNORMAL LOW (ref 12.0–15.0)
Immature Granulocytes: 0 %
Lymphocytes Relative: 41 %
Lymphs Abs: 2.6 K/uL (ref 0.7–4.0)
MCH: 32.2 pg (ref 26.0–34.0)
MCHC: 32.5 g/dL (ref 30.0–36.0)
MCV: 98.9 fL (ref 80.0–100.0)
Monocytes Absolute: 0.5 K/uL (ref 0.1–1.0)
Monocytes Relative: 8 %
Neutro Abs: 3 K/uL (ref 1.7–7.7)
Neutrophils Relative %: 48 %
Platelets: 372 K/uL (ref 150–400)
RBC: 3.7 MIL/uL — ABNORMAL LOW (ref 3.87–5.11)
RDW: 12.1 % (ref 11.5–15.5)
WBC: 6.2 K/uL (ref 4.0–10.5)
nRBC: 0 % (ref 0.0–0.2)

## 2024-02-07 LAB — BASIC METABOLIC PANEL WITH GFR
Anion gap: 9 (ref 5–15)
BUN: 13 mg/dL (ref 6–20)
CO2: 25 mmol/L (ref 22–32)
Calcium: 9.4 mg/dL (ref 8.9–10.3)
Chloride: 105 mmol/L (ref 98–111)
Creatinine, Ser: 0.79 mg/dL (ref 0.44–1.00)
GFR, Estimated: >60 mL/min (ref >60)
Glucose, Bld: 76 mg/dL (ref 70–99)
Potassium: 4.1 mmol/L (ref 3.5–5.1)
Sodium: 138 mmol/L (ref 135–145)

## 2024-02-07 NOTE — ED Notes (Signed)
 CBG 80

## 2024-02-07 NOTE — ED Triage Notes (Signed)
 Patient report hypoglycemia while working tonight. Patient states she had orange juice and peanut butter before coming here. Patient report tremors and dizziness. Patient denies SOB and CP. Patient denies N/V.

## 2024-02-07 NOTE — Discharge Instructions (Signed)
 Make sure to eat appropriately to ensure that your blood sugar does not drop too low.

## 2024-02-07 NOTE — ED Provider Notes (Signed)
 [place] EMERGENCY DEPARTMENT AT [facility] Provider Note   CSN: [id number] Arrival date & time: 02/07/24  1740     Patient presents with: Hypoglycemia   [name] is a 43 y.o. female.   The history is provided by the patient and medical records. No language interpreter was used.  Hypoglycemia    43 year old female with history of type 2 diabetes currently on metformin  and not on any sulfonylurea or insulin  presenting with concerns of low blood sugar.  Patient states while at work today, she felt jittery and shaky.  She checked her blood sugar and was reading at 68.  She then ate some crackers and drink some juice for which her symptoms improved but not fully resolved.  She still feels a bit sleepy and tired thus prompting this ER visit.  She does not endorse any fever chills no flulike symptoms no urinary symptom no recent change in medication.  She did not eat breakfast and ate a small lunch.  She is compliant with her medication.  Her last menstrual period was several days ago.  Prior to Admission medications   Medication Sig Start Date End Date Taking? Authorizing Provider  Accu-Chek Softclix Lancets lancets Use to check blood sugar 3 times daily. 12/28/23   Newlin, Enobong, MD  albuterol  (VENTOLIN  HFA) 108 (90 Base) MCG/ACT inhaler Inhale 2 puffs into the lungs every 6  hours as needed for wheezing or shortness of breath. Patient not taking: Reported on 12/11/2023 05/30/21   Tanda Bleacher, MD  Blood Glucose Monitoring Suppl (ACCU-CHEK GUIDE) w/Device KIT Use to check blood sugar 3 times daily. 12/28/23   Newlin, Enobong, MD  buPROPion  (WELLBUTRIN  XL) 150 MG 24 hr tablet Take 1 tablet (150 mg total) by mouth daily. Patient not taking: Reported on 01/16/2024 11/30/23   Jaycee Greig PARAS, NP  Continuous Glucose Sensor (FREESTYLE LIBRE 3 PLUS SENSOR) MISC Change sensor every 15 days. 12/11/23   Tanda Bleacher, MD  fluconazole  (DIFLUCAN ) 150 MG tablet Take 1 tablet (150 mg  total) by mouth daily. Patient not taking: Reported on 12/10/2023 02/21/22   Small, Brooke L, PA  glucose blood (ACCU-CHEK GUIDE TEST) test strip Use to check blood sugar 3 times daily. 12/28/23   Newlin, Enobong, MD  metFORMIN  (GLUCOPHAGE ) 500 MG tablet Take 1 tablet (500 mg total) by mouth 2 (two) times daily with a meal. 11/30/23   Jaycee Greig PARAS, NP  metFORMIN  (GLUCOPHAGE ) 500 MG tablet Take 1 tablet (500 mg total) by mouth in the morning and 1 tablet (500 mg total) in the evening. Take with meals. 01/25/24     nicotine  (NICODERM CQ  - DOSED IN MG/24 HR) 7 mg/24hr patch Place 1 patch (7 mg total) onto the skin daily. Remove old patch before applying new one. Patient not taking: Reported on 12/10/2023 11/30/23   Jaycee Greig PARAS, NP  Nicotine  (NICODERM CQ  TD) Place 1 patch onto the skin daily as needed (for smoking cessation while hospitalized). Patient not taking: Reported on 12/10/2023    [provider]  Vitamin D , Ergocalciferol , (DRISDOL ) 1.25 MG (50000 UNIT) CAPS capsule Take 1 capsule by mouth every 7 days. Patient not taking: Reported on 12/11/2023 07/12/21   Tanda Bleacher, MD    Allergies: Benadryl [diphenhydramine hcl]    Review of Systems  All other systems reviewed and are negative.   Updated Vital Signs BP (!) 113/56   Pulse 77   Temp 98.2 F (36.8 C)   Resp 16   LMP  01/07/2024   SpO2 100%   Physical Exam Vitals and nursing note reviewed.  Constitutional:      General: She is not in acute distress.    Appearance: She is well-developed.  HENT:     Head: Atraumatic.  Eyes:     Conjunctiva/sclera: Conjunctivae normal.  Cardiovascular:     Rate and Rhythm: Normal rate and regular rhythm.     Pulses: Normal pulses.     Heart sounds: Normal heart sounds.  Pulmonary:     Effort: Pulmonary effort is normal.  Abdominal:     Palpations: Abdomen is soft.     Tenderness: There is no abdominal tenderness.  Musculoskeletal:     Cervical back: Neck supple.     Comments: 5  out of 5 strength to all 4 extremities  Skin:    Findings: No rash.  Neurological:     Mental Status: She is alert and oriented to person, place, and time.  Psychiatric:        Mood and Affect: Mood normal.     (all labs ordered are listed, but only abnormal results are displayed) Labs Reviewed  CBC WITH DIFFERENTIAL/PLATELET - Abnormal; Notable for the following components:      Result Value   RBC 3.70 (*)    Hemoglobin 11.9 (*)    All other components within normal limits  URINALYSIS, ROUTINE W REFLEX MICROSCOPIC - Abnormal; Notable for the following components:   APPearance HAZY (*)    Hgb urine dipstick SMALL (*)    Bacteria, UA RARE (*)    All other components within normal limits  BASIC METABOLIC PANEL WITH GFR  PREGNANCY, URINE  CBG MONITORING, ED    EKG: None  Radiology: No results found.   Procedures   Medications Ordered in the ED - No data to display                                  Medical Decision Making Amount and/or Complexity of Data Reviewed Labs: ordered.   BP (!) 113/56   Pulse 77   Temp 98.2 F (36.8 C)   Resp 16   LMP 01/07/2024   SpO2 100%   18:58 PM  43 year old female with history of type 2 diabetes currently on metformin  and not on any sulfonylurea or insulin  presenting with concerns of low blood sugar.  Patient states while at work today, she felt jittery and shaky.  She checked her blood sugar and was reading at 68.  She then ate some crackers and drink some juice for which her symptoms improved but not fully resolved.  She still feels a bit sleepy and tired thus prompting this ER visit.  She does not endorse any fever chills no flulike symptoms no urinary symptom no recent change in medication.  She did not eat breakfast and ate a small lunch.  She is compliant with her medication.  Her last menstrual period was several days ago.  Examination overall reassuring.  Patient is resting company in no acute discomfort.  She is mentating  appropriately.  No obvious tremors noted.  Labs obtained independently viewed inter by me and overall reassuring.  Normal CBG, pregnancy test is negative, no significant anemia or electrolyte imbalance, urinalysis without signs of urinary tract infection.  Patient otherwise stable for discharge.  Suspect her hypoglycemic episode is due to lack of nutrition consumption throughout the day today.     Final diagnoses:  Hypoglycemia    ED Discharge Orders     None          Nivia Colon, DEVONNA 02/07/24 2013    Garrick Charleston, MD 02/08/24 2231

## 2024-02-27 ENCOUNTER — Telehealth: Payer: Self-pay | Admitting: Family Medicine

## 2024-02-27 NOTE — Telephone Encounter (Signed)
 Lvm to confirm appt

## 2024-02-28 ENCOUNTER — Other Ambulatory Visit (HOSPITAL_COMMUNITY): Payer: Self-pay

## 2024-02-28 ENCOUNTER — Encounter: Payer: Self-pay | Admitting: Pharmacist

## 2024-02-28 ENCOUNTER — Ambulatory Visit: Attending: Family Medicine | Admitting: Pharmacist

## 2024-02-28 DIAGNOSIS — Z7984 Long term (current) use of oral hypoglycemic drugs: Secondary | ICD-10-CM | POA: Diagnosis not present

## 2024-02-28 DIAGNOSIS — E1165 Type 2 diabetes mellitus with hyperglycemia: Secondary | ICD-10-CM | POA: Diagnosis not present

## 2024-02-28 LAB — POCT GLYCOSYLATED HEMOGLOBIN (HGB A1C): HbA1c, POC (controlled diabetic range): 6.1 % (ref 0.0–7.0)

## 2024-02-28 MED ORDER — METFORMIN HCL ER 500 MG PO TB24
500.0000 mg | ORAL_TABLET | Freq: Every day | ORAL | 1 refills | Status: AC
  Filled 2024-02-28: qty 90, 90d supply, fill #0

## 2024-02-28 NOTE — Progress Notes (Signed)
 S:     No chief complaint on file.  43 y.o. female who presents for diabetes evaluation, education, and management. Patient arrives in good spirits and presents without any assistance.   Patient was referred and last seen by Primary Care Provider, Dr. Tanda, on 01/16/2024. I saw her on 01/17/24. Reported home blood sugars were at goal. I continued her on metformin  500 mg BID.    Since my last visit with her, pt presented to the ED on 02/07/24 with a home glucose of 68 mg/dL. Felt jittery and shaky. CBG in the ED was up to 80 mg/dL. No therapy changes made at discharge on that same day.   Today, she admits that hypoglycemia occurs due to prolonged times without eating at work. Endorses adherence to metformin  500 mg BID. Checking home CBG readings. She saw an Actor at [employer]. Work-up was suggestive of T2DM. No changes were made. A1c today is 6.1%.   Family/Social History:  Fhx: T2DM Tobacco: current 0.5 PPD smoker  Alcohol: none reported  Current diabetes medications include: metformin  500 mg BID Patient reports adherence to taking all medications as prescribed.   Insurance coverage: [place] Medicaid  Patient denies hypoglycemic events since ED visit last month.  Patient denies polyuria.  Patient denies neuropathy (nerve pain). Patient reports visual changes. Patient denies self foot exams.   Patient reported dietary habits:  -Admits to eating some fried food and snack foods -Eats out a lot  -Snacks: has some sweet cravings  -Drinks: water, Zero calorie soda   Patient-reported exercise habits:  -Walks ~8 hours during her shift -Walks 1-2x weekly; walks about an hour  O:   Lab Results  Component Value Date   HGBA1C 6.1 02/28/2024   There were no vitals filed for this visit.  Lipid Panel     Component Value Date/Time   CHOL 167 06/30/2021 1125   TRIG 94 06/30/2021 1125   HDL 41 06/30/2021 1125   CHOLHDL 4.1 06/30/2021 1125   LDLCALC 109 (H) 06/30/2021  1125   Clinical Atherosclerotic Cardiovascular Disease (ASCVD): No  The 10-year ASCVD risk score (Arnett DK, et al., 2019) is: 3.8%   Values used to calculate the score:     Age: 49 years     Clincally relevant sex: Female     Is Non-Hispanic African American: Yes     Diabetic: Yes     Tobacco smoker: Yes     Systolic Blood Pressure: 115 mmHg     Is BP treated: No     HDL Cholesterol: 41 mg/dL     Total Cholesterol: 153 mg/dL   Patient is participating in a Managed Medicaid Plan: No    A/P: Diabetes newly diagnosed currently well-controlled with A1c of 6.1% today. Patient is not currently symptomatic but is able to verbalize appropriate hypoglycemia management plan. Medication adherence appears to be appropriate. Will change BID metformin  regular release to 500 mg XR once daily. If hypoglycemia persists, she has been advised to stop the metformin .  -DISCONTINUED metformin  500 mg BID. -STARTED metformin  500 mg XR once daily.  -Patient educated on purpose, proper use, and potential adverse effects of metformin .  -Extensively discussed pathophysiology of diabetes, recommended lifestyle interventions, dietary effects on blood sugar control.  -Counseled on s/sx of and management of hypoglycemia.  -Next A1c anticipated 05/2024.   Written patient instructions provided. Patient verbalized understanding of treatment plan.  Total time in face to face counseling 30 minutes.    Follow-up:  Pharmacist in  3 months.  Herlene Fleeta Morris, PharmD, JAQUELINE, CPP Clinical Pharmacist [facility] [place] [phone]

## 2024-02-29 ENCOUNTER — Telehealth: Payer: Self-pay | Admitting: Pharmacist

## 2024-02-29 NOTE — Telephone Encounter (Signed)
 Call returned to provide clarity. 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 This letter was not worded correctly. I provided clarification that Ms. Darrough is responsible to make sure she is fit for her work duties. I just wanted to ensure that she is able to step aside and check her sugar at work in the event that she feels low. This clarification was accepted by her employer. He will contact me if further direction is needed.   Herlene Fleeta Morris, PharmD, JAQUELINE, CPP Clinical Pharmacist [facility] [phone]

## 2024-02-29 NOTE — Telephone Encounter (Signed)
 Copied from CRM [id number]. Topic: General - Other >> Feb 29, 2024 10:43 AM Larissa RAMAN wrote: Reason for CRM: Patient's employer, [name] at [employer], has questions regarding work restrictions letter. He states the provider listed on the letter is Garnette Salinas Ausdall. Requesting a callback.  Office# [phone] Cell# [phone]

## 2024-04-15 ENCOUNTER — Encounter: Admitting: Skilled Nursing Facility1

## 2024-04-17 ENCOUNTER — Encounter: Payer: Self-pay | Admitting: Family Medicine

## 2024-04-17 ENCOUNTER — Other Ambulatory Visit: Payer: Self-pay | Admitting: Family Medicine

## 2024-04-17 ENCOUNTER — Telehealth: Payer: Self-pay

## 2024-04-17 ENCOUNTER — Other Ambulatory Visit (HOSPITAL_COMMUNITY)
Admission: RE | Admit: 2024-04-17 | Discharge: 2024-04-17 | Disposition: A | Source: Ambulatory Visit | Attending: Family Medicine | Admitting: Family Medicine

## 2024-04-17 ENCOUNTER — Ambulatory Visit (INDEPENDENT_AMBULATORY_CARE_PROVIDER_SITE_OTHER): Admitting: Family Medicine

## 2024-04-17 VITALS — BP 113/71 | HR 72 | Ht 59.0 in | Wt 191.0 lb

## 2024-04-17 DIAGNOSIS — Z113 Encounter for screening for infections with a predominantly sexual mode of transmission: Secondary | ICD-10-CM | POA: Insufficient documentation

## 2024-04-17 DIAGNOSIS — Z0001 Encounter for general adult medical examination with abnormal findings: Secondary | ICD-10-CM

## 2024-04-17 DIAGNOSIS — Z130 Encounter for screening for diseases of the blood and blood-forming organs and certain disorders involving the immune mechanism: Secondary | ICD-10-CM

## 2024-04-17 DIAGNOSIS — E1165 Type 2 diabetes mellitus with hyperglycemia: Secondary | ICD-10-CM | POA: Diagnosis not present

## 2024-04-17 DIAGNOSIS — Z1231 Encounter for screening mammogram for malignant neoplasm of breast: Secondary | ICD-10-CM

## 2024-04-17 DIAGNOSIS — Z1211 Encounter for screening for malignant neoplasm of colon: Secondary | ICD-10-CM

## 2024-04-17 DIAGNOSIS — Z7984 Long term (current) use of oral hypoglycemic drugs: Secondary | ICD-10-CM | POA: Diagnosis not present

## 2024-04-17 DIAGNOSIS — Z136 Encounter for screening for cardiovascular disorders: Secondary | ICD-10-CM

## 2024-04-17 DIAGNOSIS — Z0000 Encounter for general adult medical examination without abnormal findings: Secondary | ICD-10-CM

## 2024-04-17 DIAGNOSIS — N6452 Nipple discharge: Secondary | ICD-10-CM

## 2024-04-17 NOTE — Telephone Encounter (Signed)
"  Order placed. Patient advised.  "

## 2024-04-17 NOTE — Progress Notes (Signed)
 "  Established Patient Office Visit  Subjective    Patient ID: [name], female    DOB: Mar 06, 1981  Age: 44 y.o. MRN: [id number]  CC:  Chief Complaint  Patient presents with   Annual Exam    HPI [name] presents for routine annual exam. Patient denies acute complaints.   Outpatient Encounter Medications as of 04/17/2024  Medication Sig   metFORMIN  (GLUCOPHAGE -XR) 500 MG 24 hr tablet Take 1 tablet (500 mg total) by mouth daily with breakfast.   Accu-Chek Softclix Lancets lancets Use to check blood sugar 3 times daily.   albuterol  (VENTOLIN  HFA) 108 (90 Base) MCG/ACT inhaler Inhale 2 puffs into the lungs every 6  hours as needed for wheezing or shortness of breath. (Patient not taking: Reported on 12/11/2023)   Blood Glucose Monitoring Suppl (ACCU-CHEK GUIDE) w/Device KIT Use to check blood sugar 3 times daily.   buPROPion  (WELLBUTRIN  XL) 150 MG 24 hr tablet Take 1 tablet (150 mg total) by mouth daily. (Patient not taking: Reported on 01/16/2024)   Continuous Glucose Sensor (FREESTYLE LIBRE 3 PLUS SENSOR) MISC Change sensor every 15 days.   fluconazole  (DIFLUCAN ) 150 MG tablet Take 1 tablet (150 mg total) by mouth daily. (Patient not taking: Reported on 12/10/2023)   glucose blood (ACCU-CHEK GUIDE TEST) test strip Use to check blood sugar 3 times daily.   [DISCONTINUED] nicotine  (NICODERM CQ  - DOSED IN MG/24 HR) 7 mg/24hr patch Place 1 patch (7 mg total) onto the skin daily. Remove old patch before applying new one. (Patient not taking: Reported on 12/10/2023)   [DISCONTINUED] Nicotine  (NICODERM CQ  TD) Place 1 patch onto the skin daily as needed (for smoking cessation while hospitalized). (Patient not taking: Reported on 12/10/2023)   [DISCONTINUED] Vitamin D , Ergocalciferol , (DRISDOL ) 1.25 MG (50000 UNIT) CAPS capsule Take 1 capsule by mouth every 7 days. (Patient not taking: Reported on 12/11/2023)   No facility-administered encounter medications on file as of 04/17/2024.     History reviewed. No pertinent past medical history.  Past Surgical History:  Procedure Laterality Date   CESAREAN SECTION      Family History  Problem Relation Age of Onset   Diabetes Mother    Diabetes Father    Heart Problems Father    Hypertension Father     Social History   Socioeconomic History   Marital status: Single    Spouse name: Not on file   Number of children: Not on file   Years of education: Not on file   Highest education level: 12th grade  Occupational History   Not on file  Tobacco Use   Smoking status: Every Day    Current packs/day: 0.50    Types: Cigarettes   Smokeless tobacco: Never  Substance and Sexual Activity   Alcohol use: Not Currently    Comment: socially   Drug use: No   Sexual activity: Yes    Birth control/protection: None  Other Topics Concern   Not on file  Social History Narrative   Not on file   Social Drivers of Health   Tobacco Use: High Risk (04/17/2024)   Patient History    Smoking Tobacco Use: Every Day    Smokeless Tobacco Use: Never    Passive Exposure: Not on file  Financial Resource Strain: High Risk (01/15/2024)   Overall Financial Resource Strain (CARDIA)    Difficulty of Paying Living Expenses: Hard  Food Insecurity: Food Insecurity Present (01/15/2024)   Epic    Worried About Running  Out of Food in the Last Year: Often true    Ran Out of Food in the Last Year: Often true  Transportation Needs: No Transportation Needs (01/15/2024)   Epic    Lack of Transportation (Medical): No    Lack of Transportation (Non-Medical): No  Physical Activity: Sufficiently Active (01/15/2024)   Exercise Vital Sign    Days of Exercise per Week: 5 days    Minutes of Exercise per Session: 30 min  Stress: Stress Concern Present (01/15/2024)   [employer] of Occupational Health - Occupational Stress Questionnaire    Feeling of Stress: To some extent  Social Connections: Moderately Isolated (01/15/2024)   Social  Connection and Isolation Panel    Frequency of Communication with Friends and Family: More than three times a week    Frequency of Social Gatherings with Friends and Family: Once a week    Attends Religious Services: 1 to 4 times per year    Active Member of [employer] or Organizations: No    Attends Banker Meetings: Not on file    Marital Status: Divorced  Intimate Partner Violence: Not At Risk (11/30/2023)   Epic    Fear of Current or Ex-Partner: No    Emotionally Abused: No    Physically Abused: No    Sexually Abused: No  Depression (PHQ2-9): Medium Risk (11/30/2023)   Depression (PHQ2-9)    PHQ-2 Score: 8  Alcohol Screen: Low Risk (01/15/2024)   Alcohol Screen    Last Alcohol Screening Score (AUDIT): 0  Housing: High Risk (01/15/2024)   Epic    Unable to Pay for Housing in the Last Year: Yes    Number of Times Moved in the Last Year: 0    Homeless in the Last Year: No  Utilities: Not At Risk (11/30/2023)   Epic    Threatened with loss of utilities: No  Health Literacy: Adequate Health Literacy (11/30/2023)   B1300 Health Literacy    Frequency of need for help with medical instructions: Never    Review of Systems  All other systems reviewed and are negative.       Objective    BP 113/71   Pulse 72   Ht 4' 11 (1.499 m)   Wt 191 lb (86.6 kg)   LMP 03/26/2024 (Exact Date)   SpO2 96%   BMI 38.58 kg/m   Physical Exam Vitals and nursing note reviewed.  Constitutional:      General: She is not in acute distress.    Appearance: She is obese.  HENT:     Head: Normocephalic and atraumatic.     Right Ear: Tympanic membrane, ear canal and external ear normal.     Left Ear: Tympanic membrane, ear canal and external ear normal.     Nose: Nose normal.     Mouth/Throat:     Mouth: Mucous membranes are moist.     Pharynx: Oropharynx is clear.  Eyes:     Conjunctiva/sclera: Conjunctivae normal.     Pupils: Pupils are equal, round, and reactive to light.  Neck:      Thyroid: No thyromegaly.  Cardiovascular:     Rate and Rhythm: Normal rate and regular rhythm.     Heart sounds: Normal heart sounds. No murmur heard. Pulmonary:     Effort: Pulmonary effort is normal. No respiratory distress.     Breath sounds: Normal breath sounds.  Abdominal:     General: There is no distension.     Palpations: Abdomen is soft.  There is no mass.     Tenderness: There is no abdominal tenderness.  Musculoskeletal:        General: Normal range of motion.     Cervical back: Normal range of motion and neck supple.  Skin:    General: Skin is warm and dry.  Neurological:     General: No focal deficit present.     Mental Status: She is alert and oriented to person, place, and time.  Psychiatric:        Mood and Affect: Mood normal.        Behavior: Behavior normal.         Assessment & Plan:   Annual physical exam -     Comprehensive metabolic panel with GFR  Screening for deficiency anemia -     CBC with Differential/Platelet  Encounter for screening for cardiovascular disorders -     Lipid panel  Screening for endocrine/metabolic/immunity disorders  Encounter for screening mammogram for malignant neoplasm of breast -     3D Screening Mammogram, Left and Right; Future  Screening for colon cancer -     Cologuard  Type 2 diabetes mellitus with hyperglycemia, without long-term current use of insulin  (HCC) -     Hemoglobin A1c  Screening for STDs (sexually transmitted diseases) -     Cervicovaginal ancillary only     No follow-ups on file.   Tanda Raguel SQUIBB, MD   "

## 2024-04-17 NOTE — Telephone Encounter (Signed)
 Copied from CRM #[id number]. Topic: Clinical - Medical Advice >> Apr 17, 2024 10:29 AM Terri MATSU wrote: Reason for CRM: Patient stated the people called her for her mammogram but they stated she needs a diagnostic mammogram and not the regular one and Dr.Wilson send it over today so they can schedule patient asap.

## 2024-04-18 ENCOUNTER — Ambulatory Visit: Payer: Self-pay | Admitting: Family Medicine

## 2024-04-18 ENCOUNTER — Encounter

## 2024-04-18 LAB — COMPREHENSIVE METABOLIC PANEL WITH GFR
ALT: 13 IU/L (ref 0–32)
AST: 13 IU/L (ref 0–40)
Albumin: 4.3 g/dL (ref 3.9–4.9)
Alkaline Phosphatase: 47 IU/L (ref 41–116)
BUN/Creatinine Ratio: 17 (ref 9–23)
BUN: 14 mg/dL (ref 6–24)
Bilirubin Total: <0.2 mg/dL (ref 0.0–1.2)
CO2: 22 mmol/L (ref 20–29)
Calcium: 9.2 mg/dL (ref 8.7–10.2)
Chloride: 101 mmol/L (ref 96–106)
Creatinine, Ser: 0.81 mg/dL (ref 0.57–1.00)
Globulin, Total: 2.9 g/dL (ref 1.5–4.5)
Glucose: 132 mg/dL — ABNORMAL HIGH (ref 70–99)
Potassium: 4.5 mmol/L (ref 3.5–5.2)
Sodium: 133 mmol/L — ABNORMAL LOW (ref 134–144)
Total Protein: 7.2 g/dL (ref 6.0–8.5)
eGFR: 91 mL/min/1.73

## 2024-04-18 LAB — HEMOGLOBIN A1C
Est. average glucose Bld gHb Est-mCnc: 123 mg/dL
Hgb A1c MFr Bld: 5.9 % — ABNORMAL HIGH (ref 4.8–5.6)

## 2024-04-18 LAB — CERVICOVAGINAL ANCILLARY ONLY
Bacterial Vaginitis (gardnerella): POSITIVE — AB
Candida Glabrata: NEGATIVE
Candida Vaginitis: NEGATIVE
Chlamydia: NEGATIVE
Comment: NEGATIVE
Comment: NEGATIVE
Comment: NEGATIVE
Comment: NEGATIVE
Comment: NEGATIVE
Comment: NORMAL
Neisseria Gonorrhea: NEGATIVE
Trichomonas: NEGATIVE

## 2024-04-18 LAB — CBC WITH DIFFERENTIAL/PLATELET
Basophils Absolute: 0 x10E3/uL (ref 0.0–0.2)
Basos: 1 %
EOS (ABSOLUTE): 0.1 x10E3/uL (ref 0.0–0.4)
Eos: 2 %
Hematocrit: 37.7 % (ref 34.0–46.6)
Hemoglobin: 12.3 g/dL (ref 11.1–15.9)
Immature Grans (Abs): 0 x10E3/uL (ref 0.0–0.1)
Immature Granulocytes: 0 %
Lymphocytes Absolute: 2.4 x10E3/uL (ref 0.7–3.1)
Lymphs: 41 %
MCH: 32.2 pg (ref 26.6–33.0)
MCHC: 32.6 g/dL (ref 31.5–35.7)
MCV: 99 fL — ABNORMAL HIGH (ref 79–97)
Monocytes Absolute: 0.5 x10E3/uL (ref 0.1–0.9)
Monocytes: 9 %
Neutrophils Absolute: 2.8 x10E3/uL (ref 1.4–7.0)
Neutrophils: 47 %
Platelets: 316 x10E3/uL (ref 150–450)
RBC: 3.82 x10E6/uL (ref 3.77–5.28)
RDW: 12.3 % (ref 11.7–15.4)
WBC: 5.8 x10E3/uL (ref 3.4–10.8)

## 2024-04-18 LAB — LIPID PANEL
Chol/HDL Ratio: 4.1 ratio (ref 0.0–4.4)
Cholesterol, Total: 171 mg/dL (ref 100–199)
HDL: 42 mg/dL
LDL Chol Calc (NIH): 110 mg/dL — ABNORMAL HIGH (ref 0–99)
Triglycerides: 101 mg/dL (ref 0–149)
VLDL Cholesterol Cal: 19 mg/dL (ref 5–40)

## 2024-04-18 NOTE — Telephone Encounter (Signed)
 Copied from CRM #[id number]. Topic: Clinical - Lab/Test Results >> Apr 18, 2024  3:46 PM Antwanette L wrote: Reason for CRM: Patient is calling because she does not understand her results(04/17/24) and would like to speak with a nurse. I attempted to contact [facility], but no one answered   Patient is calling - she has reviewed her labs and is concerned that she did not get medication to treat her BV. Patient is requesting treatment be sent to [facility]. Patient advised of policy- for calls after 2- but will send request. Patient did get lab message from provider and does not have any other questions.

## 2024-04-22 ENCOUNTER — Other Ambulatory Visit: Payer: Self-pay | Admitting: Family Medicine

## 2024-04-22 ENCOUNTER — Other Ambulatory Visit (HOSPITAL_COMMUNITY): Payer: Self-pay

## 2024-04-22 MED ORDER — METRONIDAZOLE 500 MG PO TABS
500.0000 mg | ORAL_TABLET | Freq: Two times a day (BID) | ORAL | 0 refills | Status: AC
  Filled 2024-04-22: qty 14, 7d supply, fill #0

## 2024-04-23 ENCOUNTER — Ambulatory Visit
Admission: RE | Admit: 2024-04-23 | Discharge: 2024-04-23 | Disposition: A | Source: Ambulatory Visit | Attending: Family Medicine | Admitting: Family Medicine

## 2024-04-23 ENCOUNTER — Other Ambulatory Visit: Payer: Self-pay | Admitting: Family Medicine

## 2024-04-23 ENCOUNTER — Ambulatory Visit: Payer: Self-pay | Admitting: Family Medicine

## 2024-04-23 DIAGNOSIS — N6452 Nipple discharge: Secondary | ICD-10-CM

## 2024-04-23 DIAGNOSIS — R928 Other abnormal and inconclusive findings on diagnostic imaging of breast: Secondary | ICD-10-CM

## 2024-04-24 NOTE — Progress Notes (Signed)
 Patient reviewed lab results and provider recommendations via MyChart

## 2024-04-28 ENCOUNTER — Encounter: Admitting: Skilled Nursing Facility1

## 2024-05-29 ENCOUNTER — Ambulatory Visit: Admitting: Pharmacist

## 2024-10-15 ENCOUNTER — Ambulatory Visit: Payer: Self-pay | Admitting: Family Medicine

## 2024-10-23 ENCOUNTER — Other Ambulatory Visit

## 2024-10-23 ENCOUNTER — Encounter

## 2024-12-09 ENCOUNTER — Ambulatory Visit: Admitting: Podiatry
